# Patient Record
Sex: Male | Born: 1955 | Race: White | Hispanic: No | Marital: Married | Smoking: Current every day smoker
Health system: Southern US, Community
[De-identification: ages and names within clinical notes are randomized; demographics above are authoritative.]

## PROBLEM LIST (undated history)

## (undated) DIAGNOSIS — R0600 Dyspnea, unspecified: Secondary | ICD-10-CM

## (undated) DIAGNOSIS — K219 Gastro-esophageal reflux disease without esophagitis: Secondary | ICD-10-CM

## (undated) DIAGNOSIS — G609 Hereditary and idiopathic neuropathy, unspecified: Secondary | ICD-10-CM

## (undated) DIAGNOSIS — M1990 Unspecified osteoarthritis, unspecified site: Secondary | ICD-10-CM

## (undated) DIAGNOSIS — J449 Chronic obstructive pulmonary disease, unspecified: Secondary | ICD-10-CM

## (undated) DIAGNOSIS — I700 Atherosclerosis of aorta: Secondary | ICD-10-CM

## (undated) DIAGNOSIS — R062 Wheezing: Secondary | ICD-10-CM

## (undated) DIAGNOSIS — D649 Anemia, unspecified: Secondary | ICD-10-CM

## (undated) DIAGNOSIS — R251 Tremor, unspecified: Secondary | ICD-10-CM

## (undated) DIAGNOSIS — H9190 Unspecified hearing loss, unspecified ear: Secondary | ICD-10-CM

## (undated) DIAGNOSIS — F32A Anxiety disorder, unspecified: Secondary | ICD-10-CM

## (undated) DIAGNOSIS — G629 Polyneuropathy, unspecified: Secondary | ICD-10-CM

## (undated) DIAGNOSIS — G8310 Monoplegia of lower limb affecting unspecified side: Secondary | ICD-10-CM

## (undated) DIAGNOSIS — A4902 Methicillin resistant Staphylococcus aureus infection, unspecified site: Secondary | ICD-10-CM

## (undated) DIAGNOSIS — J439 Emphysema, unspecified: Secondary | ICD-10-CM

## (undated) DIAGNOSIS — F419 Anxiety disorder, unspecified: Secondary | ICD-10-CM

## (undated) HISTORY — DX: Hereditary and idiopathic neuropathy, unspecified: G60.9

## (undated) HISTORY — DX: Anxiety disorder, unspecified: F41.9

## (undated) HISTORY — PX: COLONOSCOPY: SHX174

## (undated) HISTORY — DX: Anxiety disorder, unspecified: F32.A

## (undated) HISTORY — DX: Anemia, unspecified: D64.9

## (undated) HISTORY — PX: APPENDECTOMY: SHX54

## (undated) HISTORY — PX: FOOT SURGERY: SHX648

## (undated) HISTORY — PX: OTHER SURGICAL HISTORY: SHX169

## (undated) HISTORY — DX: Atherosclerosis of aorta: I70.0

## (undated) HISTORY — DX: Methicillin resistant Staphylococcus aureus infection, unspecified site: A49.02

## (undated) HISTORY — PX: EYE SURGERY: SHX253

## (undated) HISTORY — DX: Unspecified osteoarthritis, unspecified site: M19.90

## (undated) HISTORY — DX: Emphysema, unspecified: J43.9

## (undated) HISTORY — DX: Monoplegia of lower limb affecting unspecified side: G83.10

---
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
## 1993-09-25 HISTORY — PX: HERNIA REPAIR: SHX51

## 2008-09-25 HISTORY — PX: OTHER SURGICAL HISTORY: SHX169

## 2008-09-25 HISTORY — PX: FOOT SURGERY: SHX648

## 2017-05-07 ENCOUNTER — Encounter: Payer: Self-pay | Admitting: *Deleted

## 2017-05-10 ENCOUNTER — Ambulatory Visit
Admission: RE | Admit: 2017-05-10 | Discharge: 2017-05-10 | Disposition: A | Discharge status: Home / Self Care | Payer: Medicare Other | Source: Ambulatory Visit | Attending: Ophthalmology | Admitting: Ophthalmology

## 2017-05-10 ENCOUNTER — Ambulatory Visit: Payer: Medicare Other | Admitting: Anesthesiology

## 2017-05-10 ENCOUNTER — Encounter
Admission: RE | Disposition: A | Discharge status: Home / Self Care | Payer: Self-pay | Source: Ambulatory Visit | Attending: Ophthalmology

## 2017-05-10 DIAGNOSIS — H2512 Age-related nuclear cataract, left eye: Secondary | ICD-10-CM | POA: Insufficient documentation

## 2017-05-10 DIAGNOSIS — Z7982 Long term (current) use of aspirin: Secondary | ICD-10-CM | POA: Insufficient documentation

## 2017-05-10 DIAGNOSIS — J449 Chronic obstructive pulmonary disease, unspecified: Secondary | ICD-10-CM | POA: Diagnosis not present

## 2017-05-10 DIAGNOSIS — M1990 Unspecified osteoarthritis, unspecified site: Secondary | ICD-10-CM | POA: Diagnosis not present

## 2017-05-10 DIAGNOSIS — R251 Tremor, unspecified: Secondary | ICD-10-CM | POA: Diagnosis not present

## 2017-05-10 DIAGNOSIS — F17200 Nicotine dependence, unspecified, uncomplicated: Secondary | ICD-10-CM | POA: Diagnosis not present

## 2017-05-10 DIAGNOSIS — Z6841 Body Mass Index (BMI) 40.0 and over, adult: Secondary | ICD-10-CM | POA: Insufficient documentation

## 2017-05-10 DIAGNOSIS — Z79899 Other long term (current) drug therapy: Secondary | ICD-10-CM | POA: Insufficient documentation

## 2017-05-10 HISTORY — DX: Chronic obstructive pulmonary disease, unspecified: J44.9

## 2017-05-10 HISTORY — DX: Unspecified osteoarthritis, unspecified site: M19.90

## 2017-05-10 HISTORY — PX: CATARACT EXTRACTION W/PHACO: SHX586

## 2017-05-10 HISTORY — DX: Unspecified hearing loss, unspecified ear: H91.90

## 2017-05-10 HISTORY — DX: Dyspnea, unspecified: R06.00

## 2017-05-10 HISTORY — DX: Wheezing: R06.2

## 2017-05-10 HISTORY — DX: Polyneuropathy, unspecified: G62.9

## 2017-05-10 HISTORY — DX: Tremor, unspecified: R25.1

## 2017-05-10 SURGERY — PHACOEMULSIFICATION, CATARACT, WITH IOL INSERTION
Anesthesia: Monitor Anesthesia Care | Site: Eye | Laterality: Left | Wound class: Clean

## 2017-05-10 MED ORDER — LIDOCAINE HCL (PF) 4 % IJ SOLN
INTRAMUSCULAR | Status: DC | PRN
  Administered 2017-05-10: 4 mL via OPHTHALMIC

## 2017-05-10 MED ORDER — MOXIFLOXACIN HCL 0.5 % OP SOLN: 1.0000 [drp] | OPHTHALMIC | Status: DC | PRN

## 2017-05-10 MED ORDER — FENTANYL CITRATE (PF) 100 MCG/2ML IJ SOLN
INTRAMUSCULAR | Status: AC
  Filled 2017-05-10: qty 2

## 2017-05-10 MED ORDER — CARBACHOL 0.01 % IO SOLN
INTRAOCULAR | Status: DC | PRN
  Administered 2017-05-10: 0.5 mL via INTRAOCULAR

## 2017-05-10 MED ORDER — MIDAZOLAM HCL 2 MG/2ML IJ SOLN
INTRAMUSCULAR | Status: AC
  Filled 2017-05-10: qty 2

## 2017-05-10 MED ORDER — EPINEPHRINE PF 1 MG/ML IJ SOLN
INTRAOCULAR | Status: DC | PRN
  Administered 2017-05-10: 08:00:00 via OPHTHALMIC

## 2017-05-10 MED ORDER — MOXIFLOXACIN HCL 0.5 % OP SOLN
OPHTHALMIC | Status: DC | PRN
  Administered 2017-05-10: 0.2 mL via OPHTHALMIC

## 2017-05-10 MED ORDER — POVIDONE-IODINE 5 % OP SOLN
OPHTHALMIC | Status: DC | PRN
  Administered 2017-05-10: 1 via OPHTHALMIC

## 2017-05-10 MED ORDER — NA CHONDROIT SULF-NA HYALURON 40-30 MG/ML IO SOLN
INTRAOCULAR | Status: DC | PRN
Start: 2017-05-10 — End: 2017-05-10
  Administered 2017-05-10: 0.5 mL via INTRAOCULAR

## 2017-05-10 MED ORDER — MIDAZOLAM HCL 2 MG/2ML IJ SOLN
INTRAMUSCULAR | Status: DC | PRN
  Administered 2017-05-10 (×2): 1 mg via INTRAVENOUS

## 2017-05-10 MED ORDER — SODIUM CHLORIDE 0.9 % IV SOLN
INTRAVENOUS | Status: DC
  Administered 2017-05-10: 07:00:00 via INTRAVENOUS

## 2017-05-10 MED ORDER — ARMC OPHTHALMIC DILATING DROPS
1.0000 "application " | OPHTHALMIC | Status: AC
  Administered 2017-05-10 (×3): 1 via OPHTHALMIC

## 2017-05-10 SURGICAL SUPPLY — 16 items
DISSECTOR HYDRO NUCLEUS 50X22 (MISCELLANEOUS) ×2 IMPLANT
GLOVE BIO SURGEON STRL SZ8 (GLOVE) ×2 IMPLANT
GLOVE BIOGEL M 6.5 STRL (GLOVE) ×2 IMPLANT
GLOVE SURG LX 7.5 STRW (GLOVE) ×1
GLOVE SURG LX STRL 7.5 STRW (GLOVE) ×1 IMPLANT
GOWN STRL REUS W/ TWL LRG LVL3 (GOWN DISPOSABLE) ×2 IMPLANT
GOWN STRL REUS W/TWL LRG LVL3 (GOWN DISPOSABLE) ×2
LABEL CATARACT MEDS ST (LABEL) ×2 IMPLANT
LENS IOL TECNIS ITEC 21.0 (Intraocular Lens) ×2 IMPLANT
PACK CATARACT (MISCELLANEOUS) ×2 IMPLANT
PACK CATARACT KING (MISCELLANEOUS) ×2 IMPLANT
PACK EYE AFTER SURG (MISCELLANEOUS) ×2 IMPLANT
SOL BSS BAG (MISCELLANEOUS) ×2
SOLUTION BSS BAG (MISCELLANEOUS) ×1 IMPLANT
WATER STERILE IRR 250ML POUR (IV SOLUTION) ×2 IMPLANT
WIPE NON LINTING 3.25X3.25 (MISCELLANEOUS) ×2 IMPLANT

## 2017-05-10 NOTE — H&P (Signed)
The History and Physical notes are on paper, have been signed, and are to be scanned.   I have examined the patient and there are no changes to the H&P.   Benay Pillow 05/10/2017 8:01 AM

## 2017-05-10 NOTE — Op Note (Signed)
OPERATIVE NOTE  Jimmy Le [id number] 05/10/2017   PREOPERATIVE DIAGNOSIS:  Nuclear sclerotic cataract left eye.  H25.12   POSTOPERATIVE DIAGNOSIS:    Nuclear sclerotic cataract left eye.     PROCEDURE:  Phacoemusification with posterior chamber intraocular lens placement of the left eye   LENS:   Implant Name Type Inv. Item Serial No. Manufacturer Lot No. LRB No. Used  LENS IOL DIOP 21.0 - [id number] Intraocular Lens LENS IOL DIOP 21.0 [id number] AMO   Left 1       PCB00 +21.0   ULTRASOUND TIME: 0 minutes 24 seconds.  CDE 1.72   SURGEON:  Benay Pillow, MD, MPH   ANESTHESIA:  Topical with tetracaine drops augmented with 1% preservative-free intracameral lidocaine.  ESTIMATED BLOOD LOSS: <1 mL   COMPLICATIONS:  None.   DESCRIPTION OF PROCEDURE:  The patient was identified in the holding room and transported to the operating room and placed in the supine position under the operating microscope.  The left eye was identified as the operative eye and it was prepped and draped in the usual sterile ophthalmic fashion.   A 1.0 millimeter clear-corneal paracentesis was made at the 5:00 position. 0.5 ml of preservative-free 1% lidocaine with epinephrine was injected into the anterior chamber.  The anterior chamber was filled with Discovisc viscoelastic.  A 2.4 millimeter keratome was used to make a near-clear corneal incision at the 2:00 position.  A curvilinear capsulorrhexis was made with a cystotome and capsulorrhexis forceps.  Balanced salt solution was used to hydrodissect and hydrodelineate the nucleus.   Phacoemulsification was then used in stop and chop fashion to remove the lens nucleus and epinucleus.  The remaining cortex was then removed using the irrigation and aspiration handpiece. Discovisc was then placed into the capsular bag to distend it for lens placement.  A lens was then injected into the capsular bag.  The remaining viscoelastic was aspirated.   Wounds were hydrated  with balanced salt solution.  The anterior chamber was inflated to a physiologic pressure with balanced salt solution.  Intracameral vigamox 0.1 mL undiltued was injected into the eye and a drop placed onto the ocular surface.  No wound leaks were noted.  The patient was taken to the recovery room in stable condition without complications of anesthesia or surgery  Benay Pillow 05/10/2017, 8:34 AM

## 2017-05-10 NOTE — Discharge Instructions (Signed)
Eye Surgery Discharge Instructions  Expect mild scratchy sensation or mild soreness. DO NOT RUB YOUR EYE!  The day of surgery:  Minimal physical activity, but bed rest is not required  No reading, computer work, or close hand work  No bending, lifting, or straining.  May watch TV  For 24 hours:  No driving, legal decisions, or alcoholic beverages  Safety precautions  Eat anything you prefer: It is better to start with liquids, then soup then solid foods.  _____ Eye patch should be worn until postoperative exam tomorrow.  ____ Solar shield eyeglasses should be worn for comfort in the sunlight/patch while sleeping  Resume all regular medications including aspirin or Coumadin if these were discontinued prior to surgery. You may shower, bathe, shave, or wash your hair. Tylenol may be taken for mild discomfort.  Call your doctor if you experience significant pain, nausea, or vomiting, fever > 101 or other signs of infection. [phone] or [phone] Specific instructions:  Follow-up Information    Eulogio Bear, MD Follow up on 05/11/2017.   Specialty:  Ophthalmology Why:  10:10 in [facility] [address] Contact information: [place] [address] [phone]

## 2017-05-10 NOTE — Transfer of Care (Signed)
Immediate Anesthesia Transfer of Care Note  Patient: Jimmy Le  Procedure(s) Performed: Procedure(s) with comments: CATARACT EXTRACTION PHACO AND INTRAOCULAR LENS PLACEMENT (IOC) (Left) - [place] 00:24.4 AP% 7.0 CDE 1.72 FLUID PACK LOT # [id number]  Patient Location: PACU and Short Stay  Anesthesia Type:MAC  Level of Consciousness: awake, alert  and oriented  Airway & Oxygen Therapy: Patient Spontanous Breathing  Post-op Assessment: Report given to RN and Post -op Vital signs reviewed and stable  Post vital signs: Reviewed and stable  Last Vitals:  Vitals:   05/10/17 0636 05/10/17 0837  BP: (!) 159/61 (!) 127/58  Pulse:  61  Resp:  20  Temp:  36.5 C  SpO2:  98%    Last Pain:  Vitals:   05/10/17 0633  TempSrc: Tympanic         Complications: No apparent anesthesia complications

## 2017-05-10 NOTE — Anesthesia Post-op Follow-up Note (Signed)
Anesthesia QCDR form completed.        

## 2017-05-10 NOTE — Anesthesia Postprocedure Evaluation (Signed)
Anesthesia Post Note  Patient: Jimmy Le  Procedure(s) Performed: Procedure(s) (LRB): CATARACT EXTRACTION PHACO AND INTRAOCULAR LENS PLACEMENT (IOC) (Left)  Patient location during evaluation: Short Stay Anesthesia Type: MAC Level of consciousness: awake and alert and oriented Pain management: pain level controlled Vital Signs Assessment: post-procedure vital signs reviewed and stable Respiratory status: spontaneous breathing and nonlabored ventilation Cardiovascular status: stable Postop Assessment: no headache and adequate PO intake Anesthetic complications: no     Last Vitals:  Vitals:   05/10/17 0836 05/10/17 0837  BP: (!) 127/58 (!) 127/58  Pulse: (!) 55 61  Resp: 16 20  Temp: 36.9 C 36.5 C  SpO2: 98% 98%    Last Pain:  Vitals:   05/10/17 0836  TempSrc: Burnett Corrente

## 2017-05-10 NOTE — Anesthesia Preprocedure Evaluation (Signed)
Anesthesia Evaluation  Patient identified by MRN, date of birth, ID band Patient awake    Reviewed: Allergy & Precautions, NPO status , Patient's Chart, lab work & pertinent test results  History of Anesthesia Complications Negative for: history of anesthetic complications  Airway Mallampati: II       Dental   Pulmonary COPD, Current Smoker,           Cardiovascular (-) hypertension(-) Past MI and (-) CHF (-) dysrhythmias (-) Valvular Problems/Murmurs     Neuro/Psych neg Seizures    GI/Hepatic Neg liver ROS, neg GERD  ,  Endo/Other  neg diabetesMorbid obesity  Renal/GU negative Renal ROS     Musculoskeletal   Abdominal   Peds  Hematology   Anesthesia Other Findings   Reproductive/Obstetrics                            Anesthesia Physical Anesthesia Plan  ASA: III  Anesthesia Plan: MAC   Post-op Pain Management:    Induction:   PONV Risk Score and Plan:   Airway Management Planned: Nasal Cannula  Additional Equipment:   Intra-op Plan:   Post-operative Plan:   Informed Consent: I have reviewed the patients History and Physical, chart, labs and discussed the procedure including the risks, benefits and alternatives for the proposed anesthesia with the patient or authorized representative who has indicated his/her understanding and acceptance.     Plan Discussed with:   Anesthesia Plan Comments:         Anesthesia Quick Evaluation

## 2017-05-11 ENCOUNTER — Encounter: Payer: Self-pay | Admitting: Ophthalmology

## 2017-06-05 ENCOUNTER — Encounter: Payer: Self-pay | Admitting: *Deleted

## 2017-06-07 ENCOUNTER — Ambulatory Visit
Admission: RE | Admit: 2017-06-07 | Discharge: 2017-06-07 | Disposition: A | Discharge status: Home / Self Care | Payer: Medicare Other | Source: Ambulatory Visit | Attending: Ophthalmology | Admitting: Ophthalmology

## 2017-06-07 ENCOUNTER — Ambulatory Visit: Payer: Medicare Other | Admitting: Anesthesiology

## 2017-06-07 ENCOUNTER — Encounter
Admission: RE | Disposition: A | Discharge status: Home / Self Care | Payer: Self-pay | Source: Ambulatory Visit | Attending: Ophthalmology

## 2017-06-07 DIAGNOSIS — R251 Tremor, unspecified: Secondary | ICD-10-CM | POA: Insufficient documentation

## 2017-06-07 DIAGNOSIS — M1990 Unspecified osteoarthritis, unspecified site: Secondary | ICD-10-CM | POA: Diagnosis not present

## 2017-06-07 DIAGNOSIS — J439 Emphysema, unspecified: Secondary | ICD-10-CM | POA: Diagnosis not present

## 2017-06-07 DIAGNOSIS — I10 Essential (primary) hypertension: Secondary | ICD-10-CM | POA: Insufficient documentation

## 2017-06-07 DIAGNOSIS — Z6841 Body Mass Index (BMI) 40.0 and over, adult: Secondary | ICD-10-CM | POA: Diagnosis not present

## 2017-06-07 DIAGNOSIS — G629 Polyneuropathy, unspecified: Secondary | ICD-10-CM | POA: Diagnosis not present

## 2017-06-07 DIAGNOSIS — Z7982 Long term (current) use of aspirin: Secondary | ICD-10-CM | POA: Insufficient documentation

## 2017-06-07 DIAGNOSIS — Z79899 Other long term (current) drug therapy: Secondary | ICD-10-CM | POA: Insufficient documentation

## 2017-06-07 DIAGNOSIS — H2511 Age-related nuclear cataract, right eye: Secondary | ICD-10-CM | POA: Diagnosis present

## 2017-06-07 HISTORY — PX: CATARACT EXTRACTION W/PHACO: SHX586

## 2017-06-07 SURGERY — PHACOEMULSIFICATION, CATARACT, WITH IOL INSERTION
Anesthesia: Monitor Anesthesia Care | Site: Eye | Laterality: Right | Wound class: Clean

## 2017-06-07 MED ORDER — CARBACHOL 0.01 % IO SOLN
INTRAOCULAR | Status: DC | PRN
  Administered 2017-06-07: 0.5 mL via INTRAOCULAR

## 2017-06-07 MED ORDER — NA CHONDROIT SULF-NA HYALURON 40-30 MG/ML IO SOLN
INTRAOCULAR | Status: DC | PRN
  Administered 2017-06-07: 0.5 mL via INTRAOCULAR

## 2017-06-07 MED ORDER — LIDOCAINE HCL (PF) 4 % IJ SOLN
INTRAOCULAR | Status: DC | PRN
  Administered 2017-06-07: 4 mL via OPHTHALMIC

## 2017-06-07 MED ORDER — EPINEPHRINE PF 1 MG/ML IJ SOLN
INTRAMUSCULAR | Status: AC
  Filled 2017-06-07: qty 1

## 2017-06-07 MED ORDER — POVIDONE-IODINE 5 % OP SOLN
OPHTHALMIC | Status: DC | PRN
  Administered 2017-06-07: 1 via OPHTHALMIC

## 2017-06-07 MED ORDER — FENTANYL CITRATE (PF) 100 MCG/2ML IJ SOLN
INTRAMUSCULAR | Status: DC | PRN
  Administered 2017-06-07 (×4): 25 ug via INTRAVENOUS

## 2017-06-07 MED ORDER — POVIDONE-IODINE 5 % OP SOLN
OPHTHALMIC | Status: AC
  Filled 2017-06-07: qty 30

## 2017-06-07 MED ORDER — LIDOCAINE HCL (PF) 4 % IJ SOLN
INTRAMUSCULAR | Status: AC
  Filled 2017-06-07: qty 5

## 2017-06-07 MED ORDER — ARMC OPHTHALMIC DILATING DROPS
OPHTHALMIC | Status: AC
  Administered 2017-06-07: 1 via OPHTHALMIC
  Filled 2017-06-07: qty 0.4

## 2017-06-07 MED ORDER — ARMC OPHTHALMIC DILATING DROPS
1.0000 "application " | OPHTHALMIC | Status: AC
  Administered 2017-06-07 (×3): 1 via OPHTHALMIC

## 2017-06-07 MED ORDER — MIDAZOLAM HCL 2 MG/2ML IJ SOLN
INTRAMUSCULAR | Status: DC | PRN
  Administered 2017-06-07 (×2): 1 mg via INTRAVENOUS

## 2017-06-07 MED ORDER — MOXIFLOXACIN HCL 0.5 % OP SOLN
OPHTHALMIC | Status: AC
  Filled 2017-06-07: qty 3

## 2017-06-07 MED ORDER — MOXIFLOXACIN HCL 0.5 % OP SOLN
OPHTHALMIC | Status: DC | PRN
  Administered 2017-06-07: 0.2 mL via OPHTHALMIC

## 2017-06-07 MED ORDER — FENTANYL CITRATE (PF) 100 MCG/2ML IJ SOLN
INTRAMUSCULAR | Status: AC
  Filled 2017-06-07: qty 2

## 2017-06-07 MED ORDER — NA CHONDROIT SULF-NA HYALURON 40-17 MG/ML IO SOLN
INTRAOCULAR | Status: AC
  Filled 2017-06-07: qty 1

## 2017-06-07 MED ORDER — EPINEPHRINE PF 1 MG/ML IJ SOLN
INTRAOCULAR | Status: DC | PRN
  Administered 2017-06-07: 09:00:00 via OPHTHALMIC

## 2017-06-07 MED ORDER — MIDAZOLAM HCL 2 MG/2ML IJ SOLN
INTRAMUSCULAR | Status: AC
  Filled 2017-06-07: qty 2

## 2017-06-07 MED ORDER — SODIUM CHLORIDE 0.9 % IV SOLN
INTRAVENOUS | Status: DC
  Administered 2017-06-07: 09:00:00 via INTRAVENOUS

## 2017-06-07 MED ORDER — MOXIFLOXACIN HCL 0.5 % OP SOLN
1.0000 [drp] | OPHTHALMIC | Status: DC | PRN
Start: 2017-06-07 — End: 2017-06-09

## 2017-06-07 SURGICAL SUPPLY — 16 items
DISSECTOR HYDRO NUCLEUS 50X22 (MISCELLANEOUS) ×2 IMPLANT
GLOVE BIO SURGEON STRL SZ8 (GLOVE) ×2 IMPLANT
GLOVE BIOGEL M 6.5 STRL (GLOVE) ×2 IMPLANT
GLOVE SURG LX 7.5 STRW (GLOVE) ×1
GLOVE SURG LX STRL 7.5 STRW (GLOVE) ×1 IMPLANT
GOWN STRL REUS W/ TWL LRG LVL3 (GOWN DISPOSABLE) ×2 IMPLANT
GOWN STRL REUS W/TWL LRG LVL3 (GOWN DISPOSABLE) ×2
LABEL CATARACT MEDS ST (LABEL) ×2 IMPLANT
LENS IOL TECNIS ITEC 20.5 (Intraocular Lens) ×2 IMPLANT
PACK CATARACT (MISCELLANEOUS) ×2 IMPLANT
PACK CATARACT KING (MISCELLANEOUS) ×2 IMPLANT
PACK EYE AFTER SURG (MISCELLANEOUS) ×2 IMPLANT
SOL BSS BAG (MISCELLANEOUS) ×2
SOLUTION BSS BAG (MISCELLANEOUS) ×1 IMPLANT
WATER STERILE IRR 250ML POUR (IV SOLUTION) ×2 IMPLANT
WIPE NON LINTING 3.25X3.25 (MISCELLANEOUS) ×2 IMPLANT

## 2017-06-07 NOTE — H&P (Signed)
The History and Physical notes are on paper, have been signed, and are to be scanned.   I have examined the patient and there are no changes to the H&P.   Benay Pillow 06/07/2017 8:17 AM

## 2017-06-07 NOTE — Anesthesia Preprocedure Evaluation (Signed)
Anesthesia Evaluation  Patient identified by MRN, date of birth, ID band Patient awake    Reviewed: Allergy & Precautions, H&P , NPO status , Patient's Chart, lab work & pertinent test results, reviewed documented beta blocker date and time   Airway Mallampati: II  TM Distance: >3 FB Neck ROM: full    Dental no notable dental hx. (+) Teeth Intact, Poor Dentition   Pulmonary neg pulmonary ROS, shortness of breath and with exertion, COPD, Current Smoker,    Pulmonary exam normal breath sounds clear to auscultation       Cardiovascular Exercise Tolerance: Good hypertension, negative cardio ROS   Rhythm:regular Rate:Normal     Neuro/Psych negative neurological ROS  negative psych ROS   GI/Hepatic negative GI ROS, Neg liver ROS,   Endo/Other  negative endocrine ROSdiabetesMorbid obesity  Renal/GU      Musculoskeletal   Abdominal   Peds  Hematology negative hematology ROS (+)   Anesthesia Other Findings   Reproductive/Obstetrics negative OB ROS                             Anesthesia Physical Anesthesia Plan  ASA: III  Anesthesia Plan: MAC   Post-op Pain Management:    Induction:   PONV Risk Score and Plan:   Airway Management Planned:   Additional Equipment:   Intra-op Plan:   Post-operative Plan:   Informed Consent: I have reviewed the patients History and Physical, chart, labs and discussed the procedure including the risks, benefits and alternatives for the proposed anesthesia with the patient or authorized representative who has indicated his/her understanding and acceptance.     Plan Discussed with: CRNA  Anesthesia Plan Comments:         Anesthesia Quick Evaluation

## 2017-06-07 NOTE — Op Note (Signed)
OPERATIVE NOTE  Jimmy Le [id number] 06/07/2017   PREOPERATIVE DIAGNOSIS:  Nuclear sclerotic cataract right eye.  H25.11   POSTOPERATIVE DIAGNOSIS:    Nuclear sclerotic cataract right eye.     PROCEDURE:  Phacoemusification with posterior chamber intraocular lens placement of the right eye   LENS:   Implant Name Type Inv. Item Serial No. Manufacturer Lot No. LRB No. Used  LENS IOL DIOP 20.5 - [id number] Intraocular Lens LENS IOL DIOP 20.5 [id number] AMO   Right 1       PCB00 +20.5   ULTRASOUND TIME: 0 minutes 22.6 seconds.  CDE 2.07   SURGEON:  Benay Pillow, MD, MPH  ANESTHESIOLOGIST: Anesthesiologist: Molli Barrows, MD CRNA: Jonna Clark, CRNA   ANESTHESIA:  Topical with tetracaine drops augmented with 1% preservative-free intracameral lidocaine.  ESTIMATED BLOOD LOSS: less than 1 mL.   COMPLICATIONS:  None.   DESCRIPTION OF PROCEDURE:  The patient was identified in the holding room and transported to the operating room and placed in the supine position under the operating microscope.  The right eye was identified as the operative eye and it was prepped and draped in the usual sterile ophthalmic fashion.   A 1.0 millimeter clear-corneal paracentesis was made at the 10:30 position. 0.5 ml of preservative-free 1% lidocaine with epinephrine was injected into the anterior chamber.  The anterior chamber was filled with Discovisc viscoelastic.  A 2.4 millimeter keratome was used to make a near-clear corneal incision at the 8:00 position.  A curvilinear capsulorrhexis was made with a cystotome and capsulorrhexis forceps.  Balanced salt solution was used to hydrodissect and hydrodelineate the nucleus.   Phacoemulsification was then used in stop and chop fashion to remove the lens nucleus and epinucleus.  The remaining cortex was then removed using the irrigation and aspiration handpiece. Discovisc was then placed into the capsular bag to distend it for lens placement.  A lens  was then injected into the capsular bag.  The remaining viscoelastic was aspirated.   Wounds were hydrated with balanced salt solution.  The anterior chamber was inflated to a physiologic pressure with balanced salt solution.   Intracameral vigamox 0.1 mL undiluted was injected into the eye and a drop placed onto the ocular surface.  No wound leaks were noted.  The patient was taken to the recovery room in stable condition without complications of anesthesia or surgery  Benay Pillow 06/07/2017, 9:11 AM

## 2017-06-07 NOTE — Transfer of Care (Signed)
Immediate Anesthesia Transfer of Care Note  Patient: Jimmy Le  Procedure(s) Performed: Procedure(s) with comments: CATARACT EXTRACTION PHACO AND INTRAOCULAR LENS PLACEMENT (IOC) (Right) - [place] 00:22.6 AP% 9.2 CDE 2.07 FLUID PACK LOT # [id number] H  Patient Location: PACU and Short Stay  Anesthesia Type:MAC  Level of Consciousness: awake, alert  and oriented  Airway & Oxygen Therapy: Patient Spontanous Breathing  Post-op Assessment: Report given to RN and Post -op Vital signs reviewed and stable  Post vital signs: Reviewed and stable  Last Vitals:  Vitals:   06/07/17 0711 06/07/17 0915  BP: (!) 151/59 (!) 122/59  Pulse: 75 71  Resp: (!) 22 18  Temp: 36.8 C   SpO2: 98% 97%    Last Pain:  Vitals:   06/07/17 0711  TempSrc: Tympanic         Complications: No apparent anesthesia complications

## 2017-06-07 NOTE — Discharge Instructions (Signed)
Eye Surgery Discharge Instructions  Expect mild scratchy sensation or mild soreness. DO NOT RUB YOUR EYE!  The day of surgery:  Minimal physical activity, but bed rest is not required  No reading, computer work, or close hand work  No bending, lifting, or straining.  May watch TV  For 24 hours:  No driving, legal decisions, or alcoholic beverages  Safety precautions  Eat anything you prefer: It is better to start with liquids, then soup then solid foods.  _____ Eye patch should be worn until postoperative exam tomorrow.  ____ Solar shield eyeglasses should be worn for comfort in the sunlight/patch while sleeping  Resume all regular medications including aspirin or Coumadin if these were discontinued prior to surgery. You may shower, bathe, shave, or wash your hair. Tylenol may be taken for mild discomfort.  Call your doctor if you experience significant pain, nausea, or vomiting, fever > 101 or other signs of infection. [phone] or [phone] Specific instructions:  Follow-up Information    Eulogio Bear, MD Follow up.   Specialty:  Ophthalmology Why:  September 14 at 10:05am Contact information: [address] [place] [address] [phone]

## 2017-06-07 NOTE — Anesthesia Postprocedure Evaluation (Signed)
Anesthesia Post Note  Patient: Jimmy Le  Procedure(s) Performed: Procedure(s) (LRB): CATARACT EXTRACTION PHACO AND INTRAOCULAR LENS PLACEMENT (IOC) (Right)  Patient location during evaluation: Short Stay Anesthesia Type: MAC Level of consciousness: awake and alert and oriented Pain management: pain level controlled Vital Signs Assessment: post-procedure vital signs reviewed and stable Respiratory status: spontaneous breathing and nonlabored ventilation Cardiovascular status: stable Postop Assessment: no headache, adequate PO intake and no signs of nausea or vomiting Anesthetic complications: no     Last Vitals:  Vitals:   06/07/17 0912 06/07/17 0915  BP: (!) 122/59 (!) 122/59  Pulse: 66 71  Resp: 18 18  Temp: 36.5 C   SpO2:  97%    Last Pain:  Vitals:   06/07/17 0711  TempSrc: Tympanic                 Lanora Manis

## 2017-06-07 NOTE — Anesthesia Post-op Follow-up Note (Signed)
Anesthesia QCDR form completed.        

## 2018-09-25 DIAGNOSIS — D649 Anemia, unspecified: Secondary | ICD-10-CM | POA: Diagnosis not present

## 2018-09-25 DIAGNOSIS — J449 Chronic obstructive pulmonary disease, unspecified: Secondary | ICD-10-CM | POA: Diagnosis not present

## 2018-09-25 DIAGNOSIS — I10 Essential (primary) hypertension: Secondary | ICD-10-CM | POA: Diagnosis not present

## 2018-09-25 DIAGNOSIS — L089 Local infection of the skin and subcutaneous tissue, unspecified: Secondary | ICD-10-CM | POA: Diagnosis not present

## 2018-09-26 DIAGNOSIS — L089 Local infection of the skin and subcutaneous tissue, unspecified: Secondary | ICD-10-CM | POA: Diagnosis not present

## 2018-09-26 DIAGNOSIS — D649 Anemia, unspecified: Secondary | ICD-10-CM | POA: Diagnosis not present

## 2018-09-27 DIAGNOSIS — G629 Polyneuropathy, unspecified: Secondary | ICD-10-CM | POA: Diagnosis not present

## 2018-09-27 DIAGNOSIS — D649 Anemia, unspecified: Secondary | ICD-10-CM | POA: Diagnosis not present

## 2018-09-29 DIAGNOSIS — F419 Anxiety disorder, unspecified: Secondary | ICD-10-CM | POA: Diagnosis not present

## 2018-09-29 DIAGNOSIS — F17210 Nicotine dependence, cigarettes, uncomplicated: Secondary | ICD-10-CM | POA: Diagnosis not present

## 2018-09-29 DIAGNOSIS — G8929 Other chronic pain: Secondary | ICD-10-CM | POA: Diagnosis not present

## 2018-09-29 DIAGNOSIS — M25511 Pain in right shoulder: Secondary | ICD-10-CM | POA: Diagnosis not present

## 2018-09-29 DIAGNOSIS — L03116 Cellulitis of left lower limb: Secondary | ICD-10-CM | POA: Diagnosis not present

## 2018-09-29 DIAGNOSIS — G629 Polyneuropathy, unspecified: Secondary | ICD-10-CM | POA: Diagnosis not present

## 2018-09-29 DIAGNOSIS — J439 Emphysema, unspecified: Secondary | ICD-10-CM | POA: Diagnosis not present

## 2018-09-29 DIAGNOSIS — D509 Iron deficiency anemia, unspecified: Secondary | ICD-10-CM | POA: Diagnosis not present

## 2018-09-29 DIAGNOSIS — F329 Major depressive disorder, single episode, unspecified: Secondary | ICD-10-CM | POA: Diagnosis not present

## 2018-09-29 DIAGNOSIS — G2581 Restless legs syndrome: Secondary | ICD-10-CM | POA: Diagnosis not present

## 2018-09-29 DIAGNOSIS — I10 Essential (primary) hypertension: Secondary | ICD-10-CM | POA: Diagnosis not present

## 2018-09-29 DIAGNOSIS — G4733 Obstructive sleep apnea (adult) (pediatric): Secondary | ICD-10-CM | POA: Diagnosis not present

## 2018-09-29 DIAGNOSIS — E785 Hyperlipidemia, unspecified: Secondary | ICD-10-CM | POA: Diagnosis not present

## 2018-10-01 DIAGNOSIS — D649 Anemia, unspecified: Secondary | ICD-10-CM | POA: Diagnosis not present

## 2018-10-01 DIAGNOSIS — L84 Corns and callosities: Secondary | ICD-10-CM | POA: Diagnosis not present

## 2018-10-01 DIAGNOSIS — Z09 Encounter for follow-up examination after completed treatment for conditions other than malignant neoplasm: Secondary | ICD-10-CM | POA: Diagnosis not present

## 2018-10-01 DIAGNOSIS — Z6841 Body Mass Index (BMI) 40.0 and over, adult: Secondary | ICD-10-CM | POA: Diagnosis not present

## 2018-10-01 DIAGNOSIS — M25475 Effusion, left foot: Secondary | ICD-10-CM | POA: Diagnosis not present

## 2018-10-01 DIAGNOSIS — Z79899 Other long term (current) drug therapy: Secondary | ICD-10-CM | POA: Diagnosis not present

## 2018-10-09 DIAGNOSIS — L84 Corns and callosities: Secondary | ICD-10-CM | POA: Diagnosis not present

## 2018-10-09 DIAGNOSIS — L97521 Non-pressure chronic ulcer of other part of left foot limited to breakdown of skin: Secondary | ICD-10-CM | POA: Diagnosis not present

## 2018-10-09 DIAGNOSIS — Z6841 Body Mass Index (BMI) 40.0 and over, adult: Secondary | ICD-10-CM | POA: Diagnosis not present

## 2018-10-09 DIAGNOSIS — M25475 Effusion, left foot: Secondary | ICD-10-CM | POA: Diagnosis not present

## 2018-10-31 DIAGNOSIS — R195 Other fecal abnormalities: Secondary | ICD-10-CM | POA: Diagnosis not present

## 2018-10-31 DIAGNOSIS — K635 Polyp of colon: Secondary | ICD-10-CM | POA: Diagnosis not present

## 2018-10-31 DIAGNOSIS — D649 Anemia, unspecified: Secondary | ICD-10-CM | POA: Diagnosis not present

## 2018-10-31 DIAGNOSIS — K5790 Diverticulosis of intestine, part unspecified, without perforation or abscess without bleeding: Secondary | ICD-10-CM | POA: Diagnosis not present

## 2018-11-01 DIAGNOSIS — L97529 Non-pressure chronic ulcer of other part of left foot with unspecified severity: Secondary | ICD-10-CM | POA: Diagnosis not present

## 2018-11-01 DIAGNOSIS — M21962 Unspecified acquired deformity of left lower leg: Secondary | ICD-10-CM | POA: Diagnosis not present

## 2018-11-01 DIAGNOSIS — B351 Tinea unguium: Secondary | ICD-10-CM | POA: Diagnosis not present

## 2018-11-01 DIAGNOSIS — I739 Peripheral vascular disease, unspecified: Secondary | ICD-10-CM | POA: Diagnosis not present

## 2018-11-01 DIAGNOSIS — M21961 Unspecified acquired deformity of right lower leg: Secondary | ICD-10-CM | POA: Diagnosis not present

## 2018-11-06 DIAGNOSIS — K2970 Gastritis, unspecified, without bleeding: Secondary | ICD-10-CM | POA: Diagnosis not present

## 2018-11-06 DIAGNOSIS — K317 Polyp of stomach and duodenum: Secondary | ICD-10-CM | POA: Diagnosis not present

## 2018-11-06 DIAGNOSIS — G4700 Insomnia, unspecified: Secondary | ICD-10-CM | POA: Diagnosis not present

## 2018-11-06 DIAGNOSIS — F419 Anxiety disorder, unspecified: Secondary | ICD-10-CM | POA: Diagnosis not present

## 2018-11-06 DIAGNOSIS — K5730 Diverticulosis of large intestine without perforation or abscess without bleeding: Secondary | ICD-10-CM | POA: Diagnosis not present

## 2018-11-06 DIAGNOSIS — J449 Chronic obstructive pulmonary disease, unspecified: Secondary | ICD-10-CM | POA: Diagnosis not present

## 2018-11-06 DIAGNOSIS — E785 Hyperlipidemia, unspecified: Secondary | ICD-10-CM | POA: Diagnosis not present

## 2018-11-06 DIAGNOSIS — Z86010 Personal history of colonic polyps: Secondary | ICD-10-CM | POA: Diagnosis not present

## 2018-11-06 DIAGNOSIS — K648 Other hemorrhoids: Secondary | ICD-10-CM | POA: Diagnosis not present

## 2018-11-06 DIAGNOSIS — K621 Rectal polyp: Secondary | ICD-10-CM | POA: Diagnosis not present

## 2018-11-06 DIAGNOSIS — F17200 Nicotine dependence, unspecified, uncomplicated: Secondary | ICD-10-CM | POA: Diagnosis not present

## 2018-11-06 DIAGNOSIS — D649 Anemia, unspecified: Secondary | ICD-10-CM | POA: Diagnosis not present

## 2018-11-06 DIAGNOSIS — D123 Benign neoplasm of transverse colon: Secondary | ICD-10-CM | POA: Diagnosis not present

## 2018-11-06 DIAGNOSIS — K2960 Other gastritis without bleeding: Secondary | ICD-10-CM | POA: Diagnosis not present

## 2018-11-06 DIAGNOSIS — Z7982 Long term (current) use of aspirin: Secondary | ICD-10-CM | POA: Diagnosis not present

## 2018-11-06 DIAGNOSIS — G4733 Obstructive sleep apnea (adult) (pediatric): Secondary | ICD-10-CM | POA: Diagnosis not present

## 2018-11-06 DIAGNOSIS — E669 Obesity, unspecified: Secondary | ICD-10-CM | POA: Diagnosis not present

## 2018-11-06 DIAGNOSIS — K635 Polyp of colon: Secondary | ICD-10-CM | POA: Diagnosis not present

## 2018-11-06 DIAGNOSIS — K319 Disease of stomach and duodenum, unspecified: Secondary | ICD-10-CM | POA: Diagnosis not present

## 2018-11-06 DIAGNOSIS — F329 Major depressive disorder, single episode, unspecified: Secondary | ICD-10-CM | POA: Diagnosis not present

## 2018-11-06 DIAGNOSIS — I10 Essential (primary) hypertension: Secondary | ICD-10-CM | POA: Diagnosis not present

## 2018-11-11 ENCOUNTER — Other Ambulatory Visit (HOSPITAL_COMMUNITY): Payer: Self-pay | Admitting: Podiatry

## 2018-11-11 DIAGNOSIS — I739 Peripheral vascular disease, unspecified: Secondary | ICD-10-CM

## 2018-11-11 DIAGNOSIS — L97529 Non-pressure chronic ulcer of other part of left foot with unspecified severity: Secondary | ICD-10-CM

## 2018-11-13 ENCOUNTER — Ambulatory Visit (HOSPITAL_COMMUNITY)
Admission: RE | Admit: 2018-11-13 | Discharge: 2018-11-13 | Disposition: A | Discharge status: Home / Self Care | Payer: PPO | Source: Ambulatory Visit | Attending: Internal Medicine | Admitting: Internal Medicine

## 2018-11-13 DIAGNOSIS — G609 Hereditary and idiopathic neuropathy, unspecified: Secondary | ICD-10-CM | POA: Diagnosis not present

## 2018-11-13 DIAGNOSIS — M79672 Pain in left foot: Secondary | ICD-10-CM | POA: Diagnosis not present

## 2018-11-13 DIAGNOSIS — I739 Peripheral vascular disease, unspecified: Secondary | ICD-10-CM

## 2018-11-13 DIAGNOSIS — M79671 Pain in right foot: Secondary | ICD-10-CM | POA: Diagnosis not present

## 2018-11-13 DIAGNOSIS — L97529 Non-pressure chronic ulcer of other part of left foot with unspecified severity: Secondary | ICD-10-CM

## 2018-12-03 DIAGNOSIS — Z6841 Body Mass Index (BMI) 40.0 and over, adult: Secondary | ICD-10-CM | POA: Diagnosis not present

## 2018-12-03 DIAGNOSIS — J982 Interstitial emphysema: Secondary | ICD-10-CM | POA: Diagnosis not present

## 2018-12-03 DIAGNOSIS — D649 Anemia, unspecified: Secondary | ICD-10-CM | POA: Diagnosis not present

## 2018-12-03 DIAGNOSIS — G609 Hereditary and idiopathic neuropathy, unspecified: Secondary | ICD-10-CM | POA: Diagnosis not present

## 2018-12-03 DIAGNOSIS — R7303 Prediabetes: Secondary | ICD-10-CM | POA: Diagnosis not present

## 2018-12-03 DIAGNOSIS — Z720 Tobacco use: Secondary | ICD-10-CM | POA: Diagnosis not present

## 2018-12-03 DIAGNOSIS — M1990 Unspecified osteoarthritis, unspecified site: Secondary | ICD-10-CM | POA: Diagnosis not present

## 2018-12-03 DIAGNOSIS — E782 Mixed hyperlipidemia: Secondary | ICD-10-CM | POA: Diagnosis not present

## 2018-12-03 DIAGNOSIS — Z1331 Encounter for screening for depression: Secondary | ICD-10-CM | POA: Diagnosis not present

## 2018-12-03 DIAGNOSIS — F418 Other specified anxiety disorders: Secondary | ICD-10-CM | POA: Diagnosis not present

## 2018-12-03 DIAGNOSIS — R350 Frequency of micturition: Secondary | ICD-10-CM | POA: Diagnosis not present

## 2018-12-05 DIAGNOSIS — D126 Benign neoplasm of colon, unspecified: Secondary | ICD-10-CM | POA: Diagnosis not present

## 2018-12-05 DIAGNOSIS — D131 Benign neoplasm of stomach: Secondary | ICD-10-CM | POA: Diagnosis not present

## 2018-12-05 DIAGNOSIS — K649 Unspecified hemorrhoids: Secondary | ICD-10-CM | POA: Diagnosis not present

## 2018-12-05 DIAGNOSIS — K2960 Other gastritis without bleeding: Secondary | ICD-10-CM | POA: Diagnosis not present

## 2018-12-05 DIAGNOSIS — D649 Anemia, unspecified: Secondary | ICD-10-CM | POA: Diagnosis not present

## 2018-12-11 ENCOUNTER — Ambulatory Visit (HOSPITAL_COMMUNITY)
Admission: RE | Admit: 2018-12-11 | Discharge: 2018-12-11 | Disposition: A | Discharge status: Home / Self Care | Payer: PPO | Source: Ambulatory Visit | Attending: Internal Medicine | Admitting: Internal Medicine

## 2018-12-11 ENCOUNTER — Other Ambulatory Visit: Payer: Self-pay

## 2018-12-11 DIAGNOSIS — L97529 Non-pressure chronic ulcer of other part of left foot with unspecified severity: Secondary | ICD-10-CM

## 2018-12-11 DIAGNOSIS — I739 Peripheral vascular disease, unspecified: Secondary | ICD-10-CM

## 2018-12-13 DIAGNOSIS — I739 Peripheral vascular disease, unspecified: Secondary | ICD-10-CM | POA: Diagnosis not present

## 2018-12-13 DIAGNOSIS — L97529 Non-pressure chronic ulcer of other part of left foot with unspecified severity: Secondary | ICD-10-CM | POA: Diagnosis not present

## 2018-12-13 DIAGNOSIS — M21961 Unspecified acquired deformity of right lower leg: Secondary | ICD-10-CM | POA: Diagnosis not present

## 2018-12-13 DIAGNOSIS — M21962 Unspecified acquired deformity of left lower leg: Secondary | ICD-10-CM | POA: Diagnosis not present

## 2019-01-20 ENCOUNTER — Other Ambulatory Visit: Payer: Self-pay | Admitting: Pharmacist

## 2019-01-20 NOTE — Patient Outreach (Signed)
[facility]) Care Management  [place]   01/20/2019  Jimmy Le Nov 24, 1955 [id number]  Reason for referral: Medication Review, Medication Assistance  Referral source: Health Risk Assessmente Current insurance: [employer]  PMHx includes but not limited to:  Anemia, neuropathy, anxiety / depression, HTN, COPD, HLD, psoriatic arthropathy, RLS, OSA  Outreach:  Successful telephone call with Jimmy Le.  HIPAA identifiers verified.   Subjective:  Patient reports that he is currently in the coverage gap and co-pays have increased.  He is still able to afford medications but states it is very expensive.  He has not heard of Extra Help LIS and has not applied for it in the past.     Objective: No results found for: CREATININE  No results found for: HGBA1C  Lipid Panel  No results found for: CHOL, TRIG, HDL, CHOLHDL, VLDL, LDLCALC, LDLDIRECT  BP Readings from Last 3 Encounters:  06/07/17 132/64  05/10/17 118/65    No Known Allergies  Medications Reviewed Today    Reviewed by Rudean Haskell, RPH (Pharmacist) on 01/20/19 at [place] List Status: <None>  Medication Order Taking? Sig Documenting Provider Last Dose Status Informant  ARIPiprazole (ABILIFY) 5 MG tablet [id number] Yes Take 5 mg by mouth daily.  [provider] Taking Active Self  aspirin 81 MG tablet [id number] Yes Take 81 mg by mouth daily. [provider] Taking Active   aspirin EC 325 MG tablet [id number] Yes Take 650 mg by mouth daily as needed for mild pain or moderate pain. [provider] Taking Active Self  calcium carbonate (TUMS - DOSED IN MG ELEMENTAL CALCIUM) 500 MG chewable tablet [id number] Yes Chew 1 tablet by mouth daily as needed for indigestion or heartburn. [provider] Taking Active Self  clonazePAM (KLONOPIN) 0.5 MG tablet [id number] Yes Take 0.5 mg by mouth 2 (two) times daily. [provider] Taking Active Self   escitalopram (LEXAPRO) 20 MG tablet [id number] Yes Take 20 mg by mouth daily. [provider] Taking Active Self  omeprazole (PRILOSEC) 20 MG capsule [id number] Yes Take 20 mg by mouth daily. [provider] Taking Active   oxybutynin (DITROPAN) 5 MG tablet [id number] Yes Take 5 mg by mouth as directed. EVERY 5 TO 6 HOURS [provider] Taking Active Self          Assessment:  Drugs sorted by system:  Neurologic/Psychologic: aripiprazole, clonazepam, escitalopram  Cardiovascular: aspirin 81mg   Gastrointestinal: omeprazole, calcium carbonate  Genitourinary: oxybutynin  Medication Assistance Findings:  Medication assistance needs identified.   Extra Help:  May be eligible for Full or Partial Extra Help Low Income Subsidy eligible based on reported income and assets  Plan: . Will f/u with patient on Friday per his request to continue applying for Extra Help  Ralene Bathe, PharmD, [place] [phone]

## 2019-01-24 ENCOUNTER — Other Ambulatory Visit: Payer: Self-pay | Admitting: Pharmacist

## 2019-01-24 ENCOUNTER — Other Ambulatory Visit: Payer: Self-pay | Admitting: *Deleted

## 2019-01-24 ENCOUNTER — Ambulatory Visit: Payer: Self-pay | Admitting: Pharmacist

## 2019-01-24 NOTE — Patient Outreach (Signed)
[place] [facility]) Care Management  [place] 01/24/2019  Jimmy Le 1955-10-15 [id number]  Reason for call: f/u on assisting patient with Extra Help LIS  Successful call with Jimmy Le today.  Patient reports he is busy right now and does not wish to move forward with Extra Help LIS application online.  I offered to call him another day but patient declined.  He states he would like to see how his new insurance handles his medications and will consider applying later in the year if needed.  I provided him with my contact information should he wish to contact me in the future.   Plan: Will close [facility] pharmacy case at this time.  Thank you for allowing [facility] pharmacy to be involved in this patient's care.     Ralene Bathe, PharmD, [place] [phone]

## 2019-01-24 NOTE — Patient Outreach (Signed)
[place] [facility]) Care Management  01/24/2019  Jimmy Le [birth year] [id number]   RN Health coach sent a welcome letter, Advance Directive packet and HTA packet as a Benefit of [employer] health plan.   Plan: Next follow up outreach within 14 business days  [place] Management [phone]

## 2019-02-03 ENCOUNTER — Other Ambulatory Visit: Payer: Self-pay | Admitting: *Deleted

## 2019-02-03 NOTE — Patient Outreach (Signed)
[facility]) Care Management  02/03/2019   Jimmy Le November 13, 1955 [id number]  RN Health Coach telephone call to patient.  Hipaa compliance verified. Per patient he was diagnosed around 6 years ago. Patient is not currently on any inhalers or nebulizer treatments. Patient stated that he is currently still smoking. Patient has an occasional cough. Patient has shortness of breath on moderate exertion. Patient ambulates with a walker. Per patient he has some neuropathy iin his feet. Patient does not drive unless he has not alternative. He still has hisi drivers license and can drive. Patient has not had a fall recently. Patient eats 3 meals a day. Per patient supper is his biggest meal. Patient is morbidly obese. RN will work with portion control next outreach. Patient has agreed to follow up outreach calls.    Current Medications:  Current Outpatient Medications  Medication Sig Dispense Refill  . ARIPiprazole (ABILIFY) 5 MG tablet Take 5 mg by mouth daily.     Marland Kitchen aspirin 81 MG tablet Take 81 mg by mouth daily.    Marland Kitchen aspirin EC 325 MG tablet Take 650 mg by mouth daily as needed for mild pain or moderate pain.    . calcium carbonate (TUMS - DOSED IN MG ELEMENTAL CALCIUM) 500 MG chewable tablet Chew 1 tablet by mouth daily as needed for indigestion or heartburn.    . clonazePAM (KLONOPIN) 0.5 MG tablet Take 0.5 mg by mouth 2 (two) times daily.    Marland Kitchen escitalopram (LEXAPRO) 20 MG tablet Take 20 mg by mouth daily.    Marland Kitchen omeprazole (PRILOSEC) 20 MG capsule Take 20 mg by mouth daily.    Marland Kitchen oxybutynin (DITROPAN) 5 MG tablet Take 5 mg by mouth as directed. EVERY 5 TO 6 HOURS     No current facility-administered medications for this visit.     Functional Status:  In your present state of health, do you have any difficulty performing the following activities: 02/03/2019  Hearing? N  Vision? N  Difficulty concentrating or making decisions? N  Walking or climbing stairs? Y  Comment Patient  ambulates with a walker/ Also patient gets shor of breath on exertion  Dressing or bathing? N  Doing errands, shopping? Y  Comment daughter takes patient he tries not to drive due to neuropathy in his Training and development officer and eating ? N  Using the Toilet? N  In the past six months, have you accidently leaked urine? Y  Do you have problems with loss of bowel control? N  Managing your Medications? N  Managing your Finances? N  Housekeeping or managing your Housekeeping? Y  Comment family takes care of home.  Some recent data might be hidden    Fall/Depression Screening: Fall Risk  02/03/2019  Falls in the past year? 1  Number falls in past yr: 0  Injury with Fall? 0  Risk for fall due to : History of fall(s);Impaired balance/gait;Impaired mobility  Follow up Falls evaluation completed;Falls prevention discussed   PHQ 2/9 Scores 02/03/2019  PHQ - 2 Score 0   THN CM Care Plan Problem One     Most Recent Value  Care Plan Problem One  Knoweledge Deficit in Self Management of COPD  Role Documenting the Problem One  Health Coach  Care Plan for Problem One  Active  THN Long Term Goal   Patient wil not have any readmission for COPD within the next 90 dayst  Interventions for Problem One Long Term Goal  RN discussed COPD exacerbation.  Patient is not on any inhalers. Patient coughs and deep breaths. RN will follow up with educational materia and further discussion  THN CM Short Term Goal #1   Patient will have a better understanding of an eating plan for COPD patient within the next 30 days  THN CM Short Term Goal #1 Start Date  02/03/19  Interventions for Short Term Goal #1  RN discussed eating frequent small meals. RN sent patient educational material on Eating plan for COPD . RN will follow up with further discussion  THN CM Short Term Goal #2   Patient will verbalize having a better understanding of COPD  and physical activity within the next 30 days  THN CM Short Term Goal #2 Start Date   02/03/19  Interventions for Short Term Goal #2  RN discussed  patient and physicial. RN sent educational material on physical activity. RN will follow up with further discussion  THN CM Short Term Goal #3  Patient will verbalize receiving information on smoking cessation within the next 30 days  THN CM Short Term Goal #3 Start Date  02/03/19  Interventions for Short Tern Goal #3  Patient stated he is still smoking. RN sent educational material on quit smoking. RN will follow up for further discussion       Assessment:  Patient is not on any inhalers or nebulizer Patient is currently smoking Patient is morbidly obese Patient will benefit from [place] telephonic outreach for education and support for COPD self management.  Plan:  RN discussed COPD exacerbation RN discussed Coronavirus safety precautions Patient has adequate food supply during pandemic Patient has medication during pandemic RN sent patient educational material on smoking cessation RN sent patient educational material on COPD and physical activity RN sent educational material on COPD and eating plan RN sent barriers letter and assessment to PCP RN will follow up within the month of August   [facility] [name] Management [phone]

## 2019-03-06 DIAGNOSIS — F3341 Major depressive disorder, recurrent, in partial remission: Secondary | ICD-10-CM | POA: Diagnosis not present

## 2019-03-14 ENCOUNTER — Other Ambulatory Visit: Payer: Self-pay | Admitting: *Deleted

## 2019-03-14 NOTE — Patient Outreach (Signed)
[place] [facility]) Care Management  03/14/2019  Jimmy Le 03-21-1956 [id number]   RN Health Coach is closing this program. Consumer is enrolled in [facility] external program.  [place] Care Management [phone]

## 2019-05-06 ENCOUNTER — Ambulatory Visit: Payer: Self-pay | Admitting: *Deleted

## 2019-06-05 DIAGNOSIS — G609 Hereditary and idiopathic neuropathy, unspecified: Secondary | ICD-10-CM | POA: Diagnosis not present

## 2019-06-05 DIAGNOSIS — M1990 Unspecified osteoarthritis, unspecified site: Secondary | ICD-10-CM | POA: Diagnosis not present

## 2019-06-05 DIAGNOSIS — E782 Mixed hyperlipidemia: Secondary | ICD-10-CM | POA: Diagnosis not present

## 2019-06-05 DIAGNOSIS — R7303 Prediabetes: Secondary | ICD-10-CM | POA: Diagnosis not present

## 2019-06-05 DIAGNOSIS — Z23 Encounter for immunization: Secondary | ICD-10-CM | POA: Diagnosis not present

## 2019-06-05 DIAGNOSIS — Z125 Encounter for screening for malignant neoplasm of prostate: Secondary | ICD-10-CM | POA: Diagnosis not present

## 2019-06-05 DIAGNOSIS — F418 Other specified anxiety disorders: Secondary | ICD-10-CM | POA: Diagnosis not present

## 2019-06-05 DIAGNOSIS — R350 Frequency of micturition: Secondary | ICD-10-CM | POA: Diagnosis not present

## 2019-06-05 DIAGNOSIS — Z720 Tobacco use: Secondary | ICD-10-CM | POA: Diagnosis not present

## 2019-06-05 DIAGNOSIS — Z87891 Personal history of nicotine dependence: Secondary | ICD-10-CM | POA: Diagnosis not present

## 2019-06-05 DIAGNOSIS — J982 Interstitial emphysema: Secondary | ICD-10-CM | POA: Diagnosis not present

## 2019-06-05 DIAGNOSIS — Z1339 Encounter for screening examination for other mental health and behavioral disorders: Secondary | ICD-10-CM | POA: Diagnosis not present

## 2019-06-13 DIAGNOSIS — F17210 Nicotine dependence, cigarettes, uncomplicated: Secondary | ICD-10-CM | POA: Diagnosis not present

## 2019-06-13 DIAGNOSIS — I700 Atherosclerosis of aorta: Secondary | ICD-10-CM | POA: Diagnosis not present

## 2019-06-13 DIAGNOSIS — I2510 Atherosclerotic heart disease of native coronary artery without angina pectoris: Secondary | ICD-10-CM | POA: Diagnosis not present

## 2019-06-13 DIAGNOSIS — J920 Pleural plaque with presence of asbestos: Secondary | ICD-10-CM | POA: Diagnosis not present

## 2019-09-03 DIAGNOSIS — H26493 Other secondary cataract, bilateral: Secondary | ICD-10-CM | POA: Diagnosis not present

## 2019-09-04 DIAGNOSIS — F3341 Major depressive disorder, recurrent, in partial remission: Secondary | ICD-10-CM | POA: Diagnosis not present

## 2019-09-11 DIAGNOSIS — Z125 Encounter for screening for malignant neoplasm of prostate: Secondary | ICD-10-CM | POA: Diagnosis not present

## 2019-09-11 DIAGNOSIS — Z9181 History of falling: Secondary | ICD-10-CM | POA: Diagnosis not present

## 2019-09-11 DIAGNOSIS — Z1331 Encounter for screening for depression: Secondary | ICD-10-CM | POA: Diagnosis not present

## 2019-09-11 DIAGNOSIS — E785 Hyperlipidemia, unspecified: Secondary | ICD-10-CM | POA: Diagnosis not present

## 2019-09-11 DIAGNOSIS — Z6837 Body mass index (BMI) 37.0-37.9, adult: Secondary | ICD-10-CM | POA: Diagnosis not present

## 2019-09-11 DIAGNOSIS — Z0000 Encounter for general adult medical examination without abnormal findings: Secondary | ICD-10-CM | POA: Diagnosis not present

## 2020-09-29 DIAGNOSIS — G8310 Monoplegia of lower limb affecting unspecified side: Secondary | ICD-10-CM | POA: Diagnosis not present

## 2020-09-29 DIAGNOSIS — L89150 Pressure ulcer of sacral region, unstageable: Secondary | ICD-10-CM | POA: Diagnosis not present

## 2020-09-29 DIAGNOSIS — K922 Gastrointestinal hemorrhage, unspecified: Secondary | ICD-10-CM | POA: Diagnosis not present

## 2020-09-29 DIAGNOSIS — D649 Anemia, unspecified: Secondary | ICD-10-CM | POA: Diagnosis not present

## 2020-09-29 DIAGNOSIS — I700 Atherosclerosis of aorta: Secondary | ICD-10-CM | POA: Diagnosis not present

## 2020-09-29 DIAGNOSIS — J982 Interstitial emphysema: Secondary | ICD-10-CM | POA: Diagnosis not present

## 2020-10-07 DIAGNOSIS — J982 Interstitial emphysema: Secondary | ICD-10-CM | POA: Diagnosis not present

## 2020-10-07 DIAGNOSIS — R350 Frequency of micturition: Secondary | ICD-10-CM | POA: Diagnosis not present

## 2020-10-07 DIAGNOSIS — D509 Iron deficiency anemia, unspecified: Secondary | ICD-10-CM | POA: Diagnosis not present

## 2020-10-07 DIAGNOSIS — L89310 Pressure ulcer of right buttock, unstageable: Secondary | ICD-10-CM | POA: Diagnosis not present

## 2020-10-07 DIAGNOSIS — M1991 Primary osteoarthritis, unspecified site: Secondary | ICD-10-CM | POA: Diagnosis not present

## 2020-10-07 DIAGNOSIS — L89320 Pressure ulcer of left buttock, unstageable: Secondary | ICD-10-CM | POA: Diagnosis not present

## 2020-10-07 DIAGNOSIS — I700 Atherosclerosis of aorta: Secondary | ICD-10-CM | POA: Diagnosis not present

## 2020-10-07 DIAGNOSIS — G8220 Paraplegia, unspecified: Secondary | ICD-10-CM | POA: Diagnosis not present

## 2020-10-07 DIAGNOSIS — Z9181 History of falling: Secondary | ICD-10-CM | POA: Diagnosis not present

## 2020-10-07 DIAGNOSIS — F17210 Nicotine dependence, cigarettes, uncomplicated: Secondary | ICD-10-CM | POA: Diagnosis not present

## 2020-10-08 DIAGNOSIS — J982 Interstitial emphysema: Secondary | ICD-10-CM | POA: Diagnosis not present

## 2020-10-08 DIAGNOSIS — G8310 Monoplegia of lower limb affecting unspecified side: Secondary | ICD-10-CM | POA: Diagnosis not present

## 2020-10-08 DIAGNOSIS — L89150 Pressure ulcer of sacral region, unstageable: Secondary | ICD-10-CM | POA: Diagnosis not present

## 2020-10-14 DIAGNOSIS — M1991 Primary osteoarthritis, unspecified site: Secondary | ICD-10-CM | POA: Diagnosis not present

## 2020-10-14 DIAGNOSIS — R350 Frequency of micturition: Secondary | ICD-10-CM | POA: Diagnosis not present

## 2020-10-14 DIAGNOSIS — J982 Interstitial emphysema: Secondary | ICD-10-CM | POA: Diagnosis not present

## 2020-10-14 DIAGNOSIS — I700 Atherosclerosis of aorta: Secondary | ICD-10-CM | POA: Diagnosis not present

## 2020-10-14 DIAGNOSIS — G8220 Paraplegia, unspecified: Secondary | ICD-10-CM | POA: Diagnosis not present

## 2020-10-14 DIAGNOSIS — L89310 Pressure ulcer of right buttock, unstageable: Secondary | ICD-10-CM | POA: Diagnosis not present

## 2020-10-14 DIAGNOSIS — L89320 Pressure ulcer of left buttock, unstageable: Secondary | ICD-10-CM | POA: Diagnosis not present

## 2020-10-14 DIAGNOSIS — F17210 Nicotine dependence, cigarettes, uncomplicated: Secondary | ICD-10-CM | POA: Diagnosis not present

## 2020-10-14 DIAGNOSIS — D509 Iron deficiency anemia, unspecified: Secondary | ICD-10-CM | POA: Diagnosis not present

## 2020-10-14 DIAGNOSIS — Z9181 History of falling: Secondary | ICD-10-CM | POA: Diagnosis not present

## 2020-10-15 DIAGNOSIS — I700 Atherosclerosis of aorta: Secondary | ICD-10-CM | POA: Diagnosis not present

## 2020-10-15 DIAGNOSIS — L89310 Pressure ulcer of right buttock, unstageable: Secondary | ICD-10-CM | POA: Diagnosis not present

## 2020-10-15 DIAGNOSIS — R350 Frequency of micturition: Secondary | ICD-10-CM | POA: Diagnosis not present

## 2020-10-15 DIAGNOSIS — F17210 Nicotine dependence, cigarettes, uncomplicated: Secondary | ICD-10-CM | POA: Diagnosis not present

## 2020-10-15 DIAGNOSIS — J982 Interstitial emphysema: Secondary | ICD-10-CM | POA: Diagnosis not present

## 2020-10-15 DIAGNOSIS — L89320 Pressure ulcer of left buttock, unstageable: Secondary | ICD-10-CM | POA: Diagnosis not present

## 2020-10-15 DIAGNOSIS — G8220 Paraplegia, unspecified: Secondary | ICD-10-CM | POA: Diagnosis not present

## 2020-10-15 DIAGNOSIS — M1991 Primary osteoarthritis, unspecified site: Secondary | ICD-10-CM | POA: Diagnosis not present

## 2020-10-15 DIAGNOSIS — Z9181 History of falling: Secondary | ICD-10-CM | POA: Diagnosis not present

## 2020-10-15 DIAGNOSIS — D509 Iron deficiency anemia, unspecified: Secondary | ICD-10-CM | POA: Diagnosis not present

## 2020-10-20 DIAGNOSIS — L89320 Pressure ulcer of left buttock, unstageable: Secondary | ICD-10-CM | POA: Diagnosis not present

## 2020-10-20 DIAGNOSIS — Z9181 History of falling: Secondary | ICD-10-CM | POA: Diagnosis not present

## 2020-10-20 DIAGNOSIS — M1991 Primary osteoarthritis, unspecified site: Secondary | ICD-10-CM | POA: Diagnosis not present

## 2020-10-20 DIAGNOSIS — G8220 Paraplegia, unspecified: Secondary | ICD-10-CM | POA: Diagnosis not present

## 2020-10-20 DIAGNOSIS — L89310 Pressure ulcer of right buttock, unstageable: Secondary | ICD-10-CM | POA: Diagnosis not present

## 2020-10-20 DIAGNOSIS — R350 Frequency of micturition: Secondary | ICD-10-CM | POA: Diagnosis not present

## 2020-10-20 DIAGNOSIS — I700 Atherosclerosis of aorta: Secondary | ICD-10-CM | POA: Diagnosis not present

## 2020-10-20 DIAGNOSIS — D509 Iron deficiency anemia, unspecified: Secondary | ICD-10-CM | POA: Diagnosis not present

## 2020-10-20 DIAGNOSIS — J982 Interstitial emphysema: Secondary | ICD-10-CM | POA: Diagnosis not present

## 2020-10-20 DIAGNOSIS — F17210 Nicotine dependence, cigarettes, uncomplicated: Secondary | ICD-10-CM | POA: Diagnosis not present

## 2020-10-25 DIAGNOSIS — L89310 Pressure ulcer of right buttock, unstageable: Secondary | ICD-10-CM | POA: Diagnosis not present

## 2020-10-25 DIAGNOSIS — Z5181 Encounter for therapeutic drug level monitoring: Secondary | ICD-10-CM | POA: Diagnosis not present

## 2020-10-25 DIAGNOSIS — J982 Interstitial emphysema: Secondary | ICD-10-CM | POA: Diagnosis not present

## 2020-10-25 DIAGNOSIS — F17210 Nicotine dependence, cigarettes, uncomplicated: Secondary | ICD-10-CM | POA: Diagnosis not present

## 2020-10-25 DIAGNOSIS — R350 Frequency of micturition: Secondary | ICD-10-CM | POA: Diagnosis not present

## 2020-10-25 DIAGNOSIS — D509 Iron deficiency anemia, unspecified: Secondary | ICD-10-CM | POA: Diagnosis not present

## 2020-10-25 DIAGNOSIS — E559 Vitamin D deficiency, unspecified: Secondary | ICD-10-CM | POA: Diagnosis not present

## 2020-10-25 DIAGNOSIS — D500 Iron deficiency anemia secondary to blood loss (chronic): Secondary | ICD-10-CM | POA: Diagnosis not present

## 2020-10-25 DIAGNOSIS — I700 Atherosclerosis of aorta: Secondary | ICD-10-CM | POA: Diagnosis not present

## 2020-10-25 DIAGNOSIS — G8220 Paraplegia, unspecified: Secondary | ICD-10-CM | POA: Diagnosis not present

## 2020-10-25 DIAGNOSIS — Z9181 History of falling: Secondary | ICD-10-CM | POA: Diagnosis not present

## 2020-10-25 DIAGNOSIS — Z79899 Other long term (current) drug therapy: Secondary | ICD-10-CM | POA: Diagnosis not present

## 2020-10-25 DIAGNOSIS — L89320 Pressure ulcer of left buttock, unstageable: Secondary | ICD-10-CM | POA: Diagnosis not present

## 2020-10-25 DIAGNOSIS — M1991 Primary osteoarthritis, unspecified site: Secondary | ICD-10-CM | POA: Diagnosis not present

## 2020-11-02 DIAGNOSIS — F17210 Nicotine dependence, cigarettes, uncomplicated: Secondary | ICD-10-CM | POA: Diagnosis not present

## 2020-11-02 DIAGNOSIS — I700 Atherosclerosis of aorta: Secondary | ICD-10-CM | POA: Diagnosis not present

## 2020-11-02 DIAGNOSIS — L89310 Pressure ulcer of right buttock, unstageable: Secondary | ICD-10-CM | POA: Diagnosis not present

## 2020-11-02 DIAGNOSIS — J982 Interstitial emphysema: Secondary | ICD-10-CM | POA: Diagnosis not present

## 2020-11-02 DIAGNOSIS — L89320 Pressure ulcer of left buttock, unstageable: Secondary | ICD-10-CM | POA: Diagnosis not present

## 2020-11-02 DIAGNOSIS — Z9181 History of falling: Secondary | ICD-10-CM | POA: Diagnosis not present

## 2020-11-02 DIAGNOSIS — D509 Iron deficiency anemia, unspecified: Secondary | ICD-10-CM | POA: Diagnosis not present

## 2020-11-02 DIAGNOSIS — R350 Frequency of micturition: Secondary | ICD-10-CM | POA: Diagnosis not present

## 2020-11-02 DIAGNOSIS — M1991 Primary osteoarthritis, unspecified site: Secondary | ICD-10-CM | POA: Diagnosis not present

## 2020-11-02 DIAGNOSIS — G8220 Paraplegia, unspecified: Secondary | ICD-10-CM | POA: Diagnosis not present

## 2020-11-08 DIAGNOSIS — J982 Interstitial emphysema: Secondary | ICD-10-CM | POA: Diagnosis not present

## 2020-11-08 DIAGNOSIS — G8310 Monoplegia of lower limb affecting unspecified side: Secondary | ICD-10-CM | POA: Diagnosis not present

## 2020-11-08 DIAGNOSIS — L89150 Pressure ulcer of sacral region, unstageable: Secondary | ICD-10-CM | POA: Diagnosis not present

## 2020-11-09 DIAGNOSIS — K922 Gastrointestinal hemorrhage, unspecified: Secondary | ICD-10-CM | POA: Diagnosis not present

## 2020-11-09 DIAGNOSIS — Z9181 History of falling: Secondary | ICD-10-CM | POA: Diagnosis not present

## 2020-11-09 DIAGNOSIS — J982 Interstitial emphysema: Secondary | ICD-10-CM | POA: Diagnosis not present

## 2020-11-09 DIAGNOSIS — G8310 Monoplegia of lower limb affecting unspecified side: Secondary | ICD-10-CM | POA: Diagnosis not present

## 2020-11-09 DIAGNOSIS — I700 Atherosclerosis of aorta: Secondary | ICD-10-CM | POA: Diagnosis not present

## 2020-11-09 DIAGNOSIS — D649 Anemia, unspecified: Secondary | ICD-10-CM | POA: Diagnosis not present

## 2020-11-09 DIAGNOSIS — L89150 Pressure ulcer of sacral region, unstageable: Secondary | ICD-10-CM | POA: Diagnosis not present

## 2020-11-10 ENCOUNTER — Other Ambulatory Visit: Payer: Self-pay | Admitting: *Deleted

## 2020-11-10 ENCOUNTER — Encounter: Payer: Self-pay | Admitting: *Deleted

## 2020-11-10 DIAGNOSIS — L89320 Pressure ulcer of left buttock, unstageable: Secondary | ICD-10-CM | POA: Diagnosis not present

## 2020-11-10 DIAGNOSIS — G8220 Paraplegia, unspecified: Secondary | ICD-10-CM | POA: Diagnosis not present

## 2020-11-10 DIAGNOSIS — I700 Atherosclerosis of aorta: Secondary | ICD-10-CM | POA: Diagnosis not present

## 2020-11-10 DIAGNOSIS — J982 Interstitial emphysema: Secondary | ICD-10-CM | POA: Diagnosis not present

## 2020-11-10 DIAGNOSIS — M1991 Primary osteoarthritis, unspecified site: Secondary | ICD-10-CM | POA: Diagnosis not present

## 2020-11-10 DIAGNOSIS — Z9181 History of falling: Secondary | ICD-10-CM | POA: Diagnosis not present

## 2020-11-10 DIAGNOSIS — F17210 Nicotine dependence, cigarettes, uncomplicated: Secondary | ICD-10-CM | POA: Diagnosis not present

## 2020-11-10 DIAGNOSIS — L89310 Pressure ulcer of right buttock, unstageable: Secondary | ICD-10-CM | POA: Diagnosis not present

## 2020-11-10 DIAGNOSIS — D509 Iron deficiency anemia, unspecified: Secondary | ICD-10-CM | POA: Diagnosis not present

## 2020-11-10 DIAGNOSIS — R350 Frequency of micturition: Secondary | ICD-10-CM | POA: Diagnosis not present

## 2020-11-11 ENCOUNTER — Encounter: Payer: Self-pay | Admitting: Neurology

## 2020-11-11 ENCOUNTER — Telehealth: Payer: Self-pay | Admitting: Neurology

## 2020-11-11 ENCOUNTER — Ambulatory Visit: Payer: Medicare Other | Admitting: Neurology

## 2020-11-11 ENCOUNTER — Other Ambulatory Visit: Payer: Self-pay

## 2020-11-11 VITALS — BP 134/67 | HR 66

## 2020-11-11 DIAGNOSIS — R202 Paresthesia of skin: Secondary | ICD-10-CM

## 2020-11-11 DIAGNOSIS — R531 Weakness: Secondary | ICD-10-CM

## 2020-11-11 DIAGNOSIS — R269 Unspecified abnormalities of gait and mobility: Secondary | ICD-10-CM | POA: Diagnosis not present

## 2020-11-11 NOTE — Telephone Encounter (Signed)
[employer] medicare order sent to [facility]. No auth they will reach out to the patient to schedule.  

## 2020-11-11 NOTE — Progress Notes (Signed)
Chief Complaint  Patient presents with   Idiopathic Neuropathy    Rm 16 New Pt  wife- Randell Loop "getting weaker in my legs, worse from knees down, no feeling in my feet, no pain, calves feel like they are asleep, unable to stand on my own"    HISTORICAL  Jimmy Le is a 65 year old male, seen in request by his primary care nurse practitioner Charlott Holler for evaluation of difficulty walking, lower extremity weakness, initial evaluation was on November 11, 2020  I reviewed and summarized the referring note.PMHX. Depression/anxiety, Abilify COPD, smoke 1ppd,  Obesity Upper GI bleeding in December 2021, hemoglobin dropped to 7.7 required blood transfusion  He went on disability since 2014 because of bilateral lower extremity weakness, sensory loss, he previously worked as a Administrator  Around 4496, he began to notice gradual onset bilateral feet numbness tingling, gradually work his way up, per patient, he was seen by neurologist at [facility], was diagnosed with neuropathy, but no treatment was offered, continue have worsening ascending paresthesia, worsening lower extremity weakness, gait abnormality, noted numbness to the mid spine level,  He also develop bilateral fingertips paresthesia, extending to bilateral wrist level  He denies significant neck, low back pain, denies bowel bladder incontinence  Laboratory evaluation in December 2021, CBC showed hemoglobin of 7.7, upper GI bleeding, received blood transfusion. Creat 1.0 stool occult blood was positive.  A1c was 6.4  REVIEW OF SYSTEMS: Full 14 system review of systems performed and notable only for as above All other review of systems were negative.  ALLERGIES: No Known Allergies  HOME MEDICATIONS: Current Outpatient Medications  Medication Sig Dispense Refill   ARIPiprazole (ABILIFY) 10 MG tablet Take 10 mg by mouth at bedtime.     clonazePAM (KLONOPIN) 0.5 MG tablet Take 0.5 mg by mouth 2 (two) times daily.      escitalopram (LEXAPRO) 20 MG tablet Take 20 mg by mouth daily.     ferrous sulfate 325 (65 FE) MG tablet Take 325 mg by mouth daily.     oxybutynin (DITROPAN) 5 MG tablet Take 5 mg by mouth as directed. EVERY 5 TO 6 HOURS     pantoprazole (PROTONIX) 20 MG tablet Take 20 mg by mouth daily.     aspirin 81 MG tablet Take 81 mg by mouth daily. (Patient not taking: Reported on 11/11/2020)     aspirin EC 325 MG tablet Take 650 mg by mouth daily as needed for mild pain or moderate pain. (Patient not taking: Reported on 11/11/2020)     brexpiprazole (REXULTI) 1 MG TABS tablet Take by mouth daily. (Patient not taking: Reported on 11/11/2020)     calcium carbonate (TUMS - DOSED IN MG ELEMENTAL CALCIUM) 500 MG chewable tablet Chew 1 tablet by mouth daily as needed for indigestion or heartburn. (Patient not taking: Reported on 11/11/2020)     No current facility-administered medications for this visit.    PAST MEDICAL HISTORY: Past Medical History:  Diagnosis Date   Anemia    Anxiety and depression    Arthritis    Atherosclerosis of aorta (HCC)    COPD (chronic obstructive pulmonary disease) (HCC)    Dyspnea    Emphysema lung (HCC)    HOH (hard of hearing)    Idiopathic peripheral neuropathy    MRSA infection    H/O   Neuropathy    Osteoarthritis    Paresis of lower extremity (HCC)    Tremors of nervous system    Wheezing  PAST SURGICAL HISTORY: Past Surgical History:  Procedure Laterality Date   APPENDECTOMY  1966   CATARACT EXTRACTION W/PHACO Left 05/10/2017   Procedure: CATARACT EXTRACTION PHACO AND INTRAOCULAR LENS PLACEMENT ([place]);  Surgeon: Eulogio Bear, MD;  Location: [facility] ORS;  Service: Ophthalmology;  Laterality: Left;  [place] 00:24.4 AP% 7.0 CDE 1.72 FLUID PACK LOT # [id number]   CATARACT EXTRACTION W/PHACO Right 06/07/2017   Procedure: CATARACT EXTRACTION PHACO AND INTRAOCULAR LENS PLACEMENT ([facility]);  Surgeon: Eulogio Bear, MD;  Location: [facility] ORS;   Service: Ophthalmology;  Laterality: Right;  [place] 00:22.6 AP% 9.2 CDE 2.07 FLUID PACK LOT # [id number]   COLONOSCOPY     FOOT SURGERY Bilateral 2010   HERNIA REPAIR  1995   RCR Right 2010    FAMILY HISTORY: Family History  Problem Relation Age of Onset   Hepatitis Mother    Hyperlipidemia Mother    Hypertension Mother    Prostate cancer Father    Stroke Father    COPD Brother    Emphysema Brother    Hypertension Brother    Heart attack Brother    Emphysema Maternal Grandmother    Stroke Maternal Grandmother    Hyperlipidemia Maternal Grandmother    Hypertension Maternal Grandmother    Heart attack Maternal Grandmother    Hyperlipidemia Paternal Grandmother    Arthritis Paternal Grandmother     SOCIAL HISTORY: Social History   Socioeconomic History   Marital status: Married    Spouse name: Copywriter, advertising   Number of children: 3   Years of education: Not on file   Highest education level: High school graduate  Occupational History   Not on file  Tobacco Use   Smoking status: Current Every Day Smoker    Packs/day: 0.75    Years: 45.00    Pack years: 33.75   Smokeless tobacco: Never Used  Substance and Sexual Activity   Alcohol use: No   Drug use: Never   Sexual activity: Not on file  Other Topics Concern   Not on file  Social History Narrative   Lives with wife   Caffeine- coffee 2 c daily   Social Determinants of Health   Financial Resource Strain: Not on file  Food Insecurity: Not on file  Transportation Needs: Not on file  Physical Activity: Not on file  Stress: Not on file  Social Connections: Not on file  Intimate Partner Violence: Not on file     PHYSICAL EXAM   Vitals:   11/11/20 1051  BP: 134/67  Pulse: 66   Not recorded     There is no height or weight on file to calculate BMI.  PHYSICAL EXAMNIATION:  Gen: NAD, conversant, well nourised, well groomed                     Cardiovascular: Regular rate rhythm,  no peripheral edema, warm, nontender. Eyes: Conjunctivae clear without exudates or hemorrhage Neck: Supple, no carotid bruits. Pulmonary: Clear to auscultation bilaterally   NEUROLOGICAL EXAM:  MENTAL STATUS: Speech:    Speech is normal; fluent and spontaneous with normal comprehension.  Cognition:     Orientation to time, place and person     Normal recent and remote memory     Normal Attention span and concentration     Normal Language, naming, repeating,spontaneous speech     Fund of knowledge   CRANIAL NERVES: CN II: Visual fields are full to confrontation. Pupils are round equal and briskly reactive to light. CN III,  IV, VI: extraocular movement are normal. No ptosis. CN V: Facial sensation is intact to light touch CN VII: Face is symmetric with normal eye closure  CN VIII: Hearing is normal to causal conversation. CN IX, X: Phonation is normal. CN XI: Head turning and shoulder shrug are intact  MOTOR: Mild bilateral hand intrinsic muscle atrophy, mild bilateral finger abduction weakness, no proximal upper extremity weakness, no significant bilateral lower extremity proximal muscle weakness, mild bilateral ankle dorsiflexion weakness,  REFLEXES: Reflexes are 2+ and symmetric at the biceps, triceps, knees, and absent at ankles. Plantar responses are flexor.  SENSORY: Length dependent decreased light touch, vibratory sensation, pinprick to mid thigh level, decreased pinprick to elbow level bilaterally  COORDINATION: There is no trunk or limb dysmetria noted.  GAIT/STANCE: Deferred  DIAGNOSTIC DATA (LABS, IMAGING, TESTING) - I reviewed patient records, labs, notes, testing and imaging myself where available.   ASSESSMENT AND PLAN  Ward Boissonneault is a 65 y.o. male   Slow progressive ascending paresthesia, gait abnormality, with evidence of distal upper and lower extremity weakness  He does have length dependent sensory changes, but also with well-preserved bilateral upper  extremity and patellar reflex  Differentiation diagnosis including small fiber neuropathy, also need to rule out the possibility of cervical spondylitic myelopathy, lumbar radiculopathy  MRI of cervical, lumbar spine  EMG nerve conduction study  Bring laboratory evaluation from his primary care physician  Marcial Pacas, M.D. Ph.D.  [facility] [address] [place], [place] [address] Ph: [phone] Fax: [phone]  CC:  Philmore Pali, NP [place],  [place] [address]

## 2020-11-12 DIAGNOSIS — Z9181 History of falling: Secondary | ICD-10-CM | POA: Diagnosis not present

## 2020-11-12 DIAGNOSIS — L89320 Pressure ulcer of left buttock, unstageable: Secondary | ICD-10-CM | POA: Diagnosis not present

## 2020-11-12 DIAGNOSIS — I700 Atherosclerosis of aorta: Secondary | ICD-10-CM | POA: Diagnosis not present

## 2020-11-12 DIAGNOSIS — F17210 Nicotine dependence, cigarettes, uncomplicated: Secondary | ICD-10-CM | POA: Diagnosis not present

## 2020-11-12 DIAGNOSIS — R350 Frequency of micturition: Secondary | ICD-10-CM | POA: Diagnosis not present

## 2020-11-12 DIAGNOSIS — L89310 Pressure ulcer of right buttock, unstageable: Secondary | ICD-10-CM | POA: Diagnosis not present

## 2020-11-12 DIAGNOSIS — J982 Interstitial emphysema: Secondary | ICD-10-CM | POA: Diagnosis not present

## 2020-11-12 DIAGNOSIS — G8220 Paraplegia, unspecified: Secondary | ICD-10-CM | POA: Diagnosis not present

## 2020-11-12 DIAGNOSIS — M1991 Primary osteoarthritis, unspecified site: Secondary | ICD-10-CM | POA: Diagnosis not present

## 2020-11-12 DIAGNOSIS — D509 Iron deficiency anemia, unspecified: Secondary | ICD-10-CM | POA: Diagnosis not present

## 2020-11-15 DIAGNOSIS — M1991 Primary osteoarthritis, unspecified site: Secondary | ICD-10-CM | POA: Diagnosis not present

## 2020-11-15 DIAGNOSIS — D509 Iron deficiency anemia, unspecified: Secondary | ICD-10-CM | POA: Diagnosis not present

## 2020-11-15 DIAGNOSIS — I700 Atherosclerosis of aorta: Secondary | ICD-10-CM | POA: Diagnosis not present

## 2020-11-15 DIAGNOSIS — G8220 Paraplegia, unspecified: Secondary | ICD-10-CM | POA: Diagnosis not present

## 2020-11-15 DIAGNOSIS — Z9181 History of falling: Secondary | ICD-10-CM | POA: Diagnosis not present

## 2020-11-15 DIAGNOSIS — F17210 Nicotine dependence, cigarettes, uncomplicated: Secondary | ICD-10-CM | POA: Diagnosis not present

## 2020-11-15 DIAGNOSIS — L89310 Pressure ulcer of right buttock, unstageable: Secondary | ICD-10-CM | POA: Diagnosis not present

## 2020-11-15 DIAGNOSIS — L89320 Pressure ulcer of left buttock, unstageable: Secondary | ICD-10-CM | POA: Diagnosis not present

## 2020-11-15 DIAGNOSIS — J982 Interstitial emphysema: Secondary | ICD-10-CM | POA: Diagnosis not present

## 2020-11-15 DIAGNOSIS — R350 Frequency of micturition: Secondary | ICD-10-CM | POA: Diagnosis not present

## 2020-11-16 DIAGNOSIS — L89310 Pressure ulcer of right buttock, unstageable: Secondary | ICD-10-CM | POA: Diagnosis not present

## 2020-11-16 DIAGNOSIS — Z9181 History of falling: Secondary | ICD-10-CM | POA: Diagnosis not present

## 2020-11-16 DIAGNOSIS — R350 Frequency of micturition: Secondary | ICD-10-CM | POA: Diagnosis not present

## 2020-11-16 DIAGNOSIS — L89320 Pressure ulcer of left buttock, unstageable: Secondary | ICD-10-CM | POA: Diagnosis not present

## 2020-11-16 DIAGNOSIS — G8220 Paraplegia, unspecified: Secondary | ICD-10-CM | POA: Diagnosis not present

## 2020-11-16 DIAGNOSIS — J982 Interstitial emphysema: Secondary | ICD-10-CM | POA: Diagnosis not present

## 2020-11-16 DIAGNOSIS — M1991 Primary osteoarthritis, unspecified site: Secondary | ICD-10-CM | POA: Diagnosis not present

## 2020-11-16 DIAGNOSIS — I700 Atherosclerosis of aorta: Secondary | ICD-10-CM | POA: Diagnosis not present

## 2020-11-16 DIAGNOSIS — D509 Iron deficiency anemia, unspecified: Secondary | ICD-10-CM | POA: Diagnosis not present

## 2020-11-16 DIAGNOSIS — F17210 Nicotine dependence, cigarettes, uncomplicated: Secondary | ICD-10-CM | POA: Diagnosis not present

## 2020-11-17 DIAGNOSIS — Z9181 History of falling: Secondary | ICD-10-CM | POA: Diagnosis not present

## 2020-11-17 DIAGNOSIS — R350 Frequency of micturition: Secondary | ICD-10-CM | POA: Diagnosis not present

## 2020-11-17 DIAGNOSIS — L89310 Pressure ulcer of right buttock, unstageable: Secondary | ICD-10-CM | POA: Diagnosis not present

## 2020-11-17 DIAGNOSIS — F17210 Nicotine dependence, cigarettes, uncomplicated: Secondary | ICD-10-CM | POA: Diagnosis not present

## 2020-11-17 DIAGNOSIS — L89320 Pressure ulcer of left buttock, unstageable: Secondary | ICD-10-CM | POA: Diagnosis not present

## 2020-11-17 DIAGNOSIS — J982 Interstitial emphysema: Secondary | ICD-10-CM | POA: Diagnosis not present

## 2020-11-17 DIAGNOSIS — I700 Atherosclerosis of aorta: Secondary | ICD-10-CM | POA: Diagnosis not present

## 2020-11-17 DIAGNOSIS — G8220 Paraplegia, unspecified: Secondary | ICD-10-CM | POA: Diagnosis not present

## 2020-11-17 DIAGNOSIS — D509 Iron deficiency anemia, unspecified: Secondary | ICD-10-CM | POA: Diagnosis not present

## 2020-11-17 DIAGNOSIS — M1991 Primary osteoarthritis, unspecified site: Secondary | ICD-10-CM | POA: Diagnosis not present

## 2020-11-19 DIAGNOSIS — L89310 Pressure ulcer of right buttock, unstageable: Secondary | ICD-10-CM | POA: Diagnosis not present

## 2020-11-19 DIAGNOSIS — L89320 Pressure ulcer of left buttock, unstageable: Secondary | ICD-10-CM | POA: Diagnosis not present

## 2020-11-19 DIAGNOSIS — Z9181 History of falling: Secondary | ICD-10-CM | POA: Diagnosis not present

## 2020-11-19 DIAGNOSIS — I700 Atherosclerosis of aorta: Secondary | ICD-10-CM | POA: Diagnosis not present

## 2020-11-19 DIAGNOSIS — G8220 Paraplegia, unspecified: Secondary | ICD-10-CM | POA: Diagnosis not present

## 2020-11-19 DIAGNOSIS — F17210 Nicotine dependence, cigarettes, uncomplicated: Secondary | ICD-10-CM | POA: Diagnosis not present

## 2020-11-19 DIAGNOSIS — M1991 Primary osteoarthritis, unspecified site: Secondary | ICD-10-CM | POA: Diagnosis not present

## 2020-11-19 DIAGNOSIS — R350 Frequency of micturition: Secondary | ICD-10-CM | POA: Diagnosis not present

## 2020-11-19 DIAGNOSIS — J982 Interstitial emphysema: Secondary | ICD-10-CM | POA: Diagnosis not present

## 2020-11-19 DIAGNOSIS — D509 Iron deficiency anemia, unspecified: Secondary | ICD-10-CM | POA: Diagnosis not present

## 2020-11-22 ENCOUNTER — Telehealth: Payer: Self-pay | Admitting: Neurology

## 2020-11-22 ENCOUNTER — Ambulatory Visit (INDEPENDENT_AMBULATORY_CARE_PROVIDER_SITE_OTHER): Payer: Medicare Other | Admitting: Neurology

## 2020-11-22 ENCOUNTER — Ambulatory Visit: Payer: Medicare Other | Admitting: Neurology

## 2020-11-22 DIAGNOSIS — E611 Iron deficiency: Secondary | ICD-10-CM | POA: Diagnosis not present

## 2020-11-22 DIAGNOSIS — E538 Deficiency of other specified B group vitamins: Secondary | ICD-10-CM | POA: Diagnosis not present

## 2020-11-22 DIAGNOSIS — R159 Full incontinence of feces: Secondary | ICD-10-CM | POA: Diagnosis not present

## 2020-11-22 DIAGNOSIS — G629 Polyneuropathy, unspecified: Secondary | ICD-10-CM

## 2020-11-22 DIAGNOSIS — R202 Paresthesia of skin: Secondary | ICD-10-CM

## 2020-11-22 DIAGNOSIS — R799 Abnormal finding of blood chemistry, unspecified: Secondary | ICD-10-CM | POA: Diagnosis not present

## 2020-11-22 DIAGNOSIS — R269 Unspecified abnormalities of gait and mobility: Secondary | ICD-10-CM | POA: Diagnosis not present

## 2020-11-22 DIAGNOSIS — R531 Weakness: Secondary | ICD-10-CM

## 2020-11-22 DIAGNOSIS — E559 Vitamin D deficiency, unspecified: Secondary | ICD-10-CM | POA: Diagnosis not present

## 2020-11-22 DIAGNOSIS — R7309 Other abnormal glucose: Secondary | ICD-10-CM | POA: Diagnosis not present

## 2020-11-22 NOTE — Telephone Encounter (Signed)
Noted, order was sent to [facility]. And they have tried to reach out to the patient to schedule.  11/15/2020-2nd attempt/epic/[facility]/lady who picked up could not hear/bcr 11-12-2020 1ST ATTEMPT EPIC ORDER [facility] PHONE LINE BUSY/DP  Ill send it again.

## 2020-11-22 NOTE — Progress Notes (Addendum)
No chief complaint on file.   HISTORICAL  Jimmy Le is a 65 year old male, seen in request by his primary care nurse practitioner Jimmy Le for evaluation of difficulty walking, lower extremity weakness, initial evaluation was on November 11, 2020  I reviewed and summarized the referring note.PMHX. Depression/anxiety, Abilify COPD, smoke 1ppd,  Obesity Upper GI bleeding in December 2021, hemoglobin dropped to 7.7 required blood transfusion  He went on disability since 2014 because of bilateral lower extremity weakness, sensory loss, he previously worked as a Administrator  Around 2637, he began to notice gradual onset bilateral feet numbness tingling, gradually work his way up, per patient, he was seen by neurologist at [facility], was diagnosed with neuropathy, but no treatment was offered, continue have worsening ascending paresthesia, worsening lower extremity weakness, gait abnormality, noted numbness to the mid spine level,  He also develop bilateral fingertips paresthesia, extending to bilateral wrist level  He denies significant neck, low back pain, denies bowel bladder incontinence  Laboratory evaluation in December 2021, CBC showed hemoglobin of 7.7, upper GI bleeding, received blood transfusion. Creat 1.0 stool occult blood was positive.  A1c was 6.4  Update November 22, 2020: He is accompanied by his wife and son at today's visit,  EMG nerve conduction study showed significant abnormality, length dependent sensorimotor polyneuropathy, with mixed demyelinating and axonal features  In addition, he has length dependent weakness, gait abnormality, but brisk reflex involving bilateral upper extremity and patella, MRI of lumbar and cervical spines are pending REVIEW OF SYSTEMS: Full 14 system review of systems performed and notable only for as above All other review of systems were negative.  ALLERGIES: No Known Allergies  HOME MEDICATIONS: Current Outpatient Medications   Medication Sig Dispense Refill  . ARIPiprazole (ABILIFY) 10 MG tablet Take 10 mg by mouth at bedtime.    Marland Kitchen aspirin 81 MG tablet Take 81 mg by mouth daily. (Patient not taking: Reported on 11/11/2020)    . aspirin EC 325 MG tablet Take 650 mg by mouth daily as needed for mild pain or moderate pain. (Patient not taking: Reported on 11/11/2020)    . brexpiprazole (REXULTI) 1 MG TABS tablet Take by mouth daily. (Patient not taking: Reported on 11/11/2020)    . calcium carbonate (TUMS - DOSED IN MG ELEMENTAL CALCIUM) 500 MG chewable tablet Chew 1 tablet by mouth daily as needed for indigestion or heartburn. (Patient not taking: Reported on 11/11/2020)    . clonazePAM (KLONOPIN) 0.5 MG tablet Take 0.5 mg by mouth 2 (two) times daily.    Marland Kitchen escitalopram (LEXAPRO) 20 MG tablet Take 20 mg by mouth daily.    . ferrous sulfate 325 (65 FE) MG tablet Take 325 mg by mouth daily.    Marland Kitchen oxybutynin (DITROPAN) 5 MG tablet Take 5 mg by mouth as directed. EVERY 5 TO 6 HOURS    . pantoprazole (PROTONIX) 20 MG tablet Take 20 mg by mouth daily.     No current facility-administered medications for this visit.    PAST MEDICAL HISTORY: Past Medical History:  Diagnosis Date  . Anemia   . Anxiety and depression   . Arthritis   . Atherosclerosis of aorta ([place])   . COPD (chronic obstructive pulmonary disease) ([place])   . Dyspnea   . Emphysema lung ([place])   . HOH (hard of hearing)   . Idiopathic peripheral neuropathy   . MRSA infection    H/O  . Neuropathy   . Osteoarthritis   . Paresis of  lower extremity ([place])   . Tremors of nervous system   . Wheezing     PAST SURGICAL HISTORY: Past Surgical History:  Procedure Laterality Date  . APPENDECTOMY  1966  . CATARACT EXTRACTION W/PHACO Left 05/10/2017   Procedure: CATARACT EXTRACTION PHACO AND INTRAOCULAR LENS PLACEMENT ([facility]);  Surgeon: Eulogio Bear, MD;  Location: [facility] ORS;  Service: Ophthalmology;  Laterality: Left;  [place] 00:24.4 AP% 7.0 CDE 1.72 FLUID PACK  LOT # [id number]  . CATARACT EXTRACTION W/PHACO Right 06/07/2017   Procedure: CATARACT EXTRACTION PHACO AND INTRAOCULAR LENS PLACEMENT ([facility]);  Surgeon: Eulogio Bear, MD;  Location: [facility] ORS;  Service: Ophthalmology;  Laterality: Right;  [place] 00:22.6 AP% 9.2 CDE 2.07 FLUID PACK LOT # [id number] H  . COLONOSCOPY    . FOOT SURGERY Bilateral 2010  . HERNIA REPAIR  1995  . RCR Right 2010    FAMILY HISTORY: Family History  Problem Relation Age of Onset  . Hepatitis Mother   . Hyperlipidemia Mother   . Hypertension Mother   . Prostate cancer Father   . Stroke Father   . COPD Brother   . Emphysema Brother   . Hypertension Brother   . Heart attack Brother   . Emphysema Maternal Grandmother   . Stroke Maternal Grandmother   . Hyperlipidemia Maternal Grandmother   . Hypertension Maternal Grandmother   . Heart attack Maternal Grandmother   . Hyperlipidemia Paternal Grandmother   . Arthritis Paternal Grandmother     SOCIAL HISTORY: Social History   Socioeconomic History  . Marital status: Married    Spouse name: Randell Loop  . Number of children: 3  . Years of education: Not on file  . Highest education level: High school graduate  Occupational History  . Not on file  Tobacco Use  . Smoking status: Current Every Day Smoker    Packs/day: 0.75    Years: 45.00    Pack years: 33.75  . Smokeless tobacco: Never Used  Substance and Sexual Activity  . Alcohol use: No  . Drug use: Never  . Sexual activity: Not on file  Other Topics Concern  . Not on file  Social History Narrative   Lives with wife   Caffeine- coffee 2 c daily   Social Determinants of Health   Financial Resource Strain: Not on file  Food Insecurity: Not on file  Transportation Needs: Not on file  Physical Activity: Not on file  Stress: Not on file  Social Connections: Not on file  Intimate Partner Violence: Not on file     PHYSICAL EXAM   There were no vitals filed for this visit. Not recorded      There is no height or weight on file to calculate BMI.  PHYSICAL EXAMNIATION:  Gen: NAD, conversant, well nourised, well groomed             NEUROLOGICAL EXAM:  MENTAL STATUS: Speech/cognition: Awake, alert, oriented to history taking and casual conversation,   CRANIAL NERVES: CN II: Visual fields are full to confrontation. Pupils are round equal and briskly reactive to light. CN III, IV, VI: extraocular movement are normal. No ptosis. CN V: Facial sensation is intact to light touch CN VII: Face is symmetric with normal eye closure  CN VIII: Hearing is normal to causal conversation. CN IX, X: Phonation is normal. CN XI: Head turning and shoulder shrug are intact  MOTOR: Mild bilateral hand intrinsic muscle atrophy, mild bilateral finger abduction weakness, no proximal upper extremity weakness, mild bilateral hip flexion  weakness, moderate bilateral ankle dorsiflexion weakness  REFLEXES: Reflexes are 2+ and symmetric at the biceps, triceps, knees, and absent at ankles. Plantar responses are flexor.  SENSORY: Length dependent decreased light touch, vibratory sensation, pinprick to upper thigh level, decreased pinprick to elbow level bilaterally  COORDINATION: There is no trunk or limb dysmetria noted.  GAIT/STANCE: Deferred  DIAGNOSTIC DATA (LABS, IMAGING, TESTING) - I reviewed patient records, labs, notes, testing and imaging myself where available.   ASSESSMENT AND PLAN  Angell Honse is a 65 y.o. 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 Ph.D.  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

## 2020-11-22 NOTE — Telephone Encounter (Signed)
Please make sure he is on schedule for MRI of the cervical and lumbar spine as previously ordered

## 2020-11-23 DIAGNOSIS — G629 Polyneuropathy, unspecified: Secondary | ICD-10-CM | POA: Insufficient documentation

## 2020-11-23 DIAGNOSIS — R350 Frequency of micturition: Secondary | ICD-10-CM | POA: Diagnosis not present

## 2020-11-23 DIAGNOSIS — M1991 Primary osteoarthritis, unspecified site: Secondary | ICD-10-CM | POA: Diagnosis not present

## 2020-11-23 DIAGNOSIS — G8220 Paraplegia, unspecified: Secondary | ICD-10-CM | POA: Diagnosis not present

## 2020-11-23 DIAGNOSIS — I700 Atherosclerosis of aorta: Secondary | ICD-10-CM | POA: Diagnosis not present

## 2020-11-23 DIAGNOSIS — L89310 Pressure ulcer of right buttock, unstageable: Secondary | ICD-10-CM | POA: Diagnosis not present

## 2020-11-23 DIAGNOSIS — D509 Iron deficiency anemia, unspecified: Secondary | ICD-10-CM | POA: Diagnosis not present

## 2020-11-23 DIAGNOSIS — Z9181 History of falling: Secondary | ICD-10-CM | POA: Diagnosis not present

## 2020-11-23 DIAGNOSIS — J982 Interstitial emphysema: Secondary | ICD-10-CM | POA: Diagnosis not present

## 2020-11-23 DIAGNOSIS — F17210 Nicotine dependence, cigarettes, uncomplicated: Secondary | ICD-10-CM | POA: Diagnosis not present

## 2020-11-23 NOTE — Procedures (Signed)
Full Name: Jimmy Le Gender: Male MRN #: [id number] Date of Birth: 1955/12/11    Visit Date: 11/22/2020 08:10 Age: 65 Years Examining Physician: Marcial Pacas, MD  Referring Physician: Marcial Pacas, MD History: 66 year old male with few years history of progressive ascending bilateral lower extremity paresthesia, weakness, gait abnormalities Summary of the test: Nerve conduction study: Right sural, superficial peroneal, ulnar sensory responses were absent.  Right radial sensory response showed moderately prolonged peak latency with significantly decreased snap amplitude.  Right median sensory response showed normal peak latency with moderately decreased snap amplitude  Right peroneal, tibial motor responses were absent.  Right ulnar motor responses were normal.  Right median motor responses showed borderline prolonged distal latency with normal CMAP amplitude,  low normal range conduction velocity. Right ulnar F-wave latency was significantly prolonged in the setting of normal right ulnar motor response.  Electromyography: Selected needle examination of left lower extremity muscles, left lumbosacral paraspinal muscles, left upper extremity muscles, left cervical paraspinal muscles were performed  There is evidence of active denervation at the left distal leg muscles, with chronic neuropathic changes  There is also evidence of enlarged motor unit potential with decreased recruitment at the left proximal lower extremity muscles, left distal upper extremity muscles.  There was no spontaneous activity at the left lumbosacral paraspinal muscles, but there is evidence of complex motor unit potential noted  There was no spontaneous activity at left cervical paraspinal muscles.  Conclusion: This is an abnormal study.  There is evidence of severe axonal sensorimotor polyneuropathy, with evidence of demyelinating features.    ------------------------------- Marcial Pacas, M.D. PhD  [facility] [address] [place], [place] [address] Tel: [phone] Fax: [phone]  Verbal informed consent was obtained from the patient, patient was informed of potential risk of procedure, including bruising, bleeding, hematoma formation, infection, muscle weakness, muscle pain, numbness, among others.        [place]    Nerve / Sites Muscle Latency Ref. Amplitude Ref. Rel Amp Segments Distance Velocity Ref. Area    ms ms mV mV %  cm m/s m/s mVms  R Median - APB     Wrist APB 4.4 ?4.4 6.2 ?4.0 100 Wrist - APB 7   17.6     Upper arm APB 9.1  4.5  71.7 Upper arm - Wrist 23 49 ?49 14.5  R Ulnar - ADM     Wrist ADM 3.2 ?3.3 7.4 ?6.0 100 Wrist - ADM 7   22.0     B.Elbow ADM 7.0  7.1  96.8 B.Elbow - Wrist 19 51 ?49 22.5     A.Elbow ADM 8.9  7.1  99.4 A.Elbow - B.Elbow 10 52 ?49 24.9  R Peroneal - EDB     Ankle EDB NR ?6.5 NR ?2.0 NR Ankle - EDB 9   NR     Fib head EDB NR  NR  NR Fib head - Ankle 30 NR ?44 NR     Pop fossa EDB NR  NR  NR Pop fossa - Fib head 10 NR ?44 NR         Pop fossa - Ankle      R Tibial - AH     Ankle AH NR ?5.8 NR ?4.0 NR Ankle - AH 9 NR  NR             SNC    Nerve / Sites Rec. Site Peak Lat Ref.  Amp Ref. Segments  Distance    ms ms V V  cm  R Radial - Anatomical snuff box (Forearm)     Forearm Wrist 2.7 ?2.9 9 ?15 Forearm - Wrist 10  R Sural - Ankle (Calf)     Calf Ankle NR ?4.4 NR ?6 Calf - Ankle 14  R Superficial peroneal - Ankle     Lat leg Ankle NR ?4.4 NR ?6 Lat leg - Ankle 14  R Median - Orthodromic (Dig II, Mid palm)     Dig II Wrist 4.1 ?3.4 3 ?10 Dig II - Wrist 13  R Ulnar - Orthodromic, (Dig V, Mid palm)     Dig V Wrist NR ?3.1 NR ?5 Dig V - Wrist 42               F  Wave    Nerve F Lat Ref.   ms ms  R Ulnar - ADM 37.8 ?32.0       EMG Summary Table    Spontaneous MUAP Recruitment  Muscle IA Fib PSW Fasc Other Amp Dur. Poly Pattern  L. Biceps brachii Increased None None None _______ Normal Normal Normal Reduced   L. Deltoid Normal None None None _______ Normal Normal Normal Normal  L. Triceps brachii Normal None None None _______ Normal Normal Normal Normal  L. Pronator teres Increased None None None _______ Normal Normal Normal Reduced  L. Brachioradialis Increased None None None _______ Normal Normal Normal Reduced  L. Extensor digitorum communis Increased 1+ None None _______ Increased Increased 1+ Reduced  L. Flexor digitorum profundus (Ulnar) Increased None None None _______ Increased Increased 1+ Reduced  L. First dorsal interosseous Increased 1+ None None _______ Increased Increased 1+ Reduced  L. Cervical paraspinals Normal None None None _______ Normal Normal Normal Normal  L. Tibialis anterior Increased 1+ None None _______ Increased Increased 1+ Reduced  L. Tibialis posterior Increased 1+ None None _______ Increased Increased 1+ Reduced  L. Peroneus longus Increased 1+ None None _______ Increased Increased 1+ Reduced  L. Gastrocnemius (Medial head) Increased 1+ 1+ None _______ Increased Increased 1+ Reduced  L. Vastus lateralis Increased None None None _______ Increased Increased 1+ Reduced  L. Rectus femoris Increased None None None _______ Increased Increased 1+ Reduced  L. Lumbar paraspinals (low) Normal None None None _______ Decreased Increased 1+ Normal  L. Lumbar paraspinals (mid) Normal None None None _______ Decreased Increased 1+ Normal

## 2020-11-24 DIAGNOSIS — I700 Atherosclerosis of aorta: Secondary | ICD-10-CM | POA: Diagnosis not present

## 2020-11-24 DIAGNOSIS — G8220 Paraplegia, unspecified: Secondary | ICD-10-CM | POA: Diagnosis not present

## 2020-11-24 DIAGNOSIS — L89310 Pressure ulcer of right buttock, unstageable: Secondary | ICD-10-CM | POA: Diagnosis not present

## 2020-11-24 DIAGNOSIS — D509 Iron deficiency anemia, unspecified: Secondary | ICD-10-CM | POA: Diagnosis not present

## 2020-11-24 DIAGNOSIS — F17210 Nicotine dependence, cigarettes, uncomplicated: Secondary | ICD-10-CM | POA: Diagnosis not present

## 2020-11-24 DIAGNOSIS — M1991 Primary osteoarthritis, unspecified site: Secondary | ICD-10-CM | POA: Diagnosis not present

## 2020-11-24 DIAGNOSIS — J982 Interstitial emphysema: Secondary | ICD-10-CM | POA: Diagnosis not present

## 2020-11-24 DIAGNOSIS — Z9181 History of falling: Secondary | ICD-10-CM | POA: Diagnosis not present

## 2020-11-24 DIAGNOSIS — R350 Frequency of micturition: Secondary | ICD-10-CM | POA: Diagnosis not present

## 2020-11-24 LAB — VITAMIN B12: Vitamin B-12: 449 pg/mL (ref 232–1245)

## 2020-11-25 ENCOUNTER — Telehealth: Payer: Self-pay | Admitting: Neurology

## 2020-11-25 DIAGNOSIS — M1991 Primary osteoarthritis, unspecified site: Secondary | ICD-10-CM | POA: Diagnosis not present

## 2020-11-25 DIAGNOSIS — G8220 Paraplegia, unspecified: Secondary | ICD-10-CM | POA: Diagnosis not present

## 2020-11-25 DIAGNOSIS — I700 Atherosclerosis of aorta: Secondary | ICD-10-CM | POA: Diagnosis not present

## 2020-11-25 DIAGNOSIS — D509 Iron deficiency anemia, unspecified: Secondary | ICD-10-CM | POA: Diagnosis not present

## 2020-11-25 DIAGNOSIS — R350 Frequency of micturition: Secondary | ICD-10-CM | POA: Diagnosis not present

## 2020-11-25 DIAGNOSIS — F17210 Nicotine dependence, cigarettes, uncomplicated: Secondary | ICD-10-CM | POA: Diagnosis not present

## 2020-11-25 DIAGNOSIS — L89310 Pressure ulcer of right buttock, unstageable: Secondary | ICD-10-CM | POA: Diagnosis not present

## 2020-11-25 DIAGNOSIS — Z9181 History of falling: Secondary | ICD-10-CM | POA: Diagnosis not present

## 2020-11-25 DIAGNOSIS — J982 Interstitial emphysema: Secondary | ICD-10-CM | POA: Diagnosis not present

## 2020-11-25 LAB — CBC WITH DIFFERENTIAL
Basophils Absolute: 0 10*3/uL (ref 0.0–0.2)
Basos: 1 %
EOS (ABSOLUTE): 0.4 10*3/uL (ref 0.0–0.4)
Eos: 5 %
Hematocrit: 40.2 % (ref 37.5–51.0)
Hemoglobin: 12.7 g/dL — ABNORMAL LOW (ref 13.0–17.7)
Immature Grans (Abs): 0 10*3/uL (ref 0.0–0.1)
Immature Granulocytes: 0 %
Lymphocytes Absolute: 1.4 10*3/uL (ref 0.7–3.1)
Lymphs: 16 %
MCH: 28.4 pg (ref 26.6–33.0)
MCHC: 31.6 g/dL (ref 31.5–35.7)
MCV: 90 fL (ref 79–97)
Monocytes Absolute: 0.5 10*3/uL (ref 0.1–0.9)
Monocytes: 5 %
Neutrophils Absolute: 6.5 10*3/uL (ref 1.4–7.0)
Neutrophils: 73 %
RBC: 4.47 x10E6/uL (ref 4.14–5.80)
RDW: 16.9 % — ABNORMAL HIGH (ref 11.6–15.4)
WBC: 8.9 10*3/uL (ref 3.4–10.8)

## 2020-11-25 LAB — VITAMIN D 25 HYDROXY (VIT D DEFICIENCY, FRACTURES): Vit D, 25-Hydroxy: 25.5 ng/mL — ABNORMAL LOW (ref 30.0–100.0)

## 2020-11-25 LAB — MULTIPLE MYELOMA PANEL, SERUM
Albumin SerPl Elph-Mcnc: 3.7 g/dL (ref 2.9–4.4)
Albumin/Glob SerPl: 0.8 (ref 0.7–1.7)
Alpha 1: 0.4 g/dL (ref 0.0–0.4)
Alpha2 Glob SerPl Elph-Mcnc: 1.1 g/dL — ABNORMAL HIGH (ref 0.4–1.0)
B-Globulin SerPl Elph-Mcnc: 1.3 g/dL (ref 0.7–1.3)
Gamma Glob SerPl Elph-Mcnc: 2.1 g/dL — ABNORMAL HIGH (ref 0.4–1.8)
Globulin, Total: 4.9 g/dL — ABNORMAL HIGH (ref 2.2–3.9)
IgA/Immunoglobulin A, Serum: 342 mg/dL (ref 61–437)
IgG (Immunoglobin G), Serum: 2073 mg/dL — ABNORMAL HIGH (ref 603–1613)
IgM (Immunoglobulin M), Srm: 131 mg/dL (ref 20–172)

## 2020-11-25 LAB — HEPATITIS PANEL, ACUTE
Hep A IgM: NEGATIVE
Hep B C IgM: NEGATIVE
Hep C Virus Ab: <0.1 s/co ratio (ref 0.0–0.9)
Hepatitis B Surface Ag: NEGATIVE

## 2020-11-25 LAB — COMPREHENSIVE METABOLIC PANEL
ALT: 11 IU/L (ref 0–44)
AST: 13 IU/L (ref 0–40)
Albumin/Globulin Ratio: 1.2 (ref 1.2–2.2)
Albumin: 4.6 g/dL (ref 3.8–4.8)
Alkaline Phosphatase: 96 IU/L (ref 44–121)
BUN/Creatinine Ratio: 11 (ref 10–24)
BUN: 11 mg/dL (ref 8–27)
Bilirubin Total: 0.5 mg/dL (ref 0.0–1.2)
CO2: 26 mmol/L (ref 20–29)
Calcium: 9.7 mg/dL (ref 8.6–10.2)
Chloride: 99 mmol/L (ref 96–106)
Creatinine, Ser: 0.96 mg/dL (ref 0.76–1.27)
Globulin, Total: 4 g/dL (ref 1.5–4.5)
Glucose: 99 mg/dL (ref 65–99)
Potassium: 4.5 mmol/L (ref 3.5–5.2)
Sodium: 140 mmol/L (ref 134–144)
Total Protein: 8.6 g/dL — ABNORMAL HIGH (ref 6.0–8.5)
eGFR: 88 mL/min/{1.73_m2} (ref >59)

## 2020-11-25 LAB — SEDIMENTATION RATE: Sed Rate: 43 mm/hr — ABNORMAL HIGH (ref 0–30)

## 2020-11-25 LAB — ANA W/REFLEX IF POSITIVE: Anti Nuclear Antibody (ANA): NEGATIVE

## 2020-11-25 LAB — TSH: TSH: 3.52 u[IU]/mL (ref 0.450–4.500)

## 2020-11-25 LAB — C-REACTIVE PROTEIN: CRP: 45 mg/L — ABNORMAL HIGH (ref 0–10)

## 2020-11-25 LAB — HIV ANTIBODY (ROUTINE TESTING W REFLEX): HIV Screen 4th Generation wRfx: NONREACTIVE

## 2020-11-25 LAB — FOLATE: Folate: 5.9 ng/mL (ref >3.0)

## 2020-11-25 LAB — IRON AND TIBC
Iron Saturation: 7 % — CL (ref 15–55)
Iron: 28 ug/dL — ABNORMAL LOW (ref 38–169)
Total Iron Binding Capacity: 398 ug/dL (ref 250–450)
UIBC: 370 ug/dL — ABNORMAL HIGH (ref 111–343)

## 2020-11-25 LAB — HGB A1C W/O EAG: Hgb A1c MFr Bld: 5.4 % (ref 4.8–5.6)

## 2020-11-25 LAB — CK: Total CK: 58 U/L (ref 41–331)

## 2020-11-25 LAB — COPPER, SERUM: Copper: 173 ug/dL — ABNORMAL HIGH (ref 69–132)

## 2020-11-25 LAB — FERRITIN: Ferritin: 26 ng/mL — ABNORMAL LOW (ref 30–400)

## 2020-11-25 LAB — RPR: RPR Ser Ql: NONREACTIVE

## 2020-11-25 NOTE — Telephone Encounter (Signed)
I spoke to patient's wife of DPR.  She verbalized understanding of the findings.  He will keep his follow-up in May as planned.  He has been taking over-the-counter iron but had decreased it to every other day.  She will make sure he restarts taking it daily.  Says his iron is being watched closely by his PCP.  She will also start him on the recommended vitamin D supplement.

## 2020-11-25 NOTE — Telephone Encounter (Signed)
Please call patient, laboratory evaluation showed  Elevated C-reactive protein 45, ESR 43, this could indicate systemic inflammatory process, will consider repeat laboratory evaluation at next visit  There is also evidence of decreased iron level, ferritin of 26, he would get benefit from over-the-counter iron supplement ferrous sulfate, take as instructed.  Vitamin D level was mildly decreased 25.5, over-the-counter D3 1000 units daily  Rest of the laboratory evaluation showed no significant abnormalities.

## 2020-11-26 DIAGNOSIS — F17210 Nicotine dependence, cigarettes, uncomplicated: Secondary | ICD-10-CM | POA: Diagnosis not present

## 2020-11-26 DIAGNOSIS — J982 Interstitial emphysema: Secondary | ICD-10-CM | POA: Diagnosis not present

## 2020-11-26 DIAGNOSIS — D509 Iron deficiency anemia, unspecified: Secondary | ICD-10-CM | POA: Diagnosis not present

## 2020-11-26 DIAGNOSIS — M1991 Primary osteoarthritis, unspecified site: Secondary | ICD-10-CM | POA: Diagnosis not present

## 2020-11-26 DIAGNOSIS — L89310 Pressure ulcer of right buttock, unstageable: Secondary | ICD-10-CM | POA: Diagnosis not present

## 2020-11-26 DIAGNOSIS — Z9181 History of falling: Secondary | ICD-10-CM | POA: Diagnosis not present

## 2020-11-26 DIAGNOSIS — G8220 Paraplegia, unspecified: Secondary | ICD-10-CM | POA: Diagnosis not present

## 2020-11-26 DIAGNOSIS — R350 Frequency of micturition: Secondary | ICD-10-CM | POA: Diagnosis not present

## 2020-11-26 DIAGNOSIS — I700 Atherosclerosis of aorta: Secondary | ICD-10-CM | POA: Diagnosis not present

## 2020-11-29 DIAGNOSIS — L89310 Pressure ulcer of right buttock, unstageable: Secondary | ICD-10-CM | POA: Diagnosis not present

## 2020-11-29 DIAGNOSIS — J982 Interstitial emphysema: Secondary | ICD-10-CM | POA: Diagnosis not present

## 2020-11-29 DIAGNOSIS — G8220 Paraplegia, unspecified: Secondary | ICD-10-CM | POA: Diagnosis not present

## 2020-11-29 DIAGNOSIS — F17210 Nicotine dependence, cigarettes, uncomplicated: Secondary | ICD-10-CM | POA: Diagnosis not present

## 2020-11-29 DIAGNOSIS — R350 Frequency of micturition: Secondary | ICD-10-CM | POA: Diagnosis not present

## 2020-11-29 DIAGNOSIS — Z9181 History of falling: Secondary | ICD-10-CM | POA: Diagnosis not present

## 2020-11-29 DIAGNOSIS — I700 Atherosclerosis of aorta: Secondary | ICD-10-CM | POA: Diagnosis not present

## 2020-11-29 DIAGNOSIS — M1991 Primary osteoarthritis, unspecified site: Secondary | ICD-10-CM | POA: Diagnosis not present

## 2020-11-29 DIAGNOSIS — D509 Iron deficiency anemia, unspecified: Secondary | ICD-10-CM | POA: Diagnosis not present

## 2020-12-01 DIAGNOSIS — F17210 Nicotine dependence, cigarettes, uncomplicated: Secondary | ICD-10-CM | POA: Diagnosis not present

## 2020-12-01 DIAGNOSIS — J982 Interstitial emphysema: Secondary | ICD-10-CM | POA: Diagnosis not present

## 2020-12-01 DIAGNOSIS — R350 Frequency of micturition: Secondary | ICD-10-CM | POA: Diagnosis not present

## 2020-12-01 DIAGNOSIS — L89310 Pressure ulcer of right buttock, unstageable: Secondary | ICD-10-CM | POA: Diagnosis not present

## 2020-12-01 DIAGNOSIS — M1991 Primary osteoarthritis, unspecified site: Secondary | ICD-10-CM | POA: Diagnosis not present

## 2020-12-01 DIAGNOSIS — I700 Atherosclerosis of aorta: Secondary | ICD-10-CM | POA: Diagnosis not present

## 2020-12-01 DIAGNOSIS — D509 Iron deficiency anemia, unspecified: Secondary | ICD-10-CM | POA: Diagnosis not present

## 2020-12-01 DIAGNOSIS — G8220 Paraplegia, unspecified: Secondary | ICD-10-CM | POA: Diagnosis not present

## 2020-12-01 DIAGNOSIS — Z9181 History of falling: Secondary | ICD-10-CM | POA: Diagnosis not present

## 2020-12-02 DIAGNOSIS — G8220 Paraplegia, unspecified: Secondary | ICD-10-CM | POA: Diagnosis not present

## 2020-12-02 DIAGNOSIS — J982 Interstitial emphysema: Secondary | ICD-10-CM | POA: Diagnosis not present

## 2020-12-02 DIAGNOSIS — L89310 Pressure ulcer of right buttock, unstageable: Secondary | ICD-10-CM | POA: Diagnosis not present

## 2020-12-02 DIAGNOSIS — Z9181 History of falling: Secondary | ICD-10-CM | POA: Diagnosis not present

## 2020-12-02 DIAGNOSIS — M1991 Primary osteoarthritis, unspecified site: Secondary | ICD-10-CM | POA: Diagnosis not present

## 2020-12-02 DIAGNOSIS — I700 Atherosclerosis of aorta: Secondary | ICD-10-CM | POA: Diagnosis not present

## 2020-12-02 DIAGNOSIS — D509 Iron deficiency anemia, unspecified: Secondary | ICD-10-CM | POA: Diagnosis not present

## 2020-12-02 DIAGNOSIS — F17210 Nicotine dependence, cigarettes, uncomplicated: Secondary | ICD-10-CM | POA: Diagnosis not present

## 2020-12-02 DIAGNOSIS — R350 Frequency of micturition: Secondary | ICD-10-CM | POA: Diagnosis not present

## 2020-12-06 DIAGNOSIS — F17210 Nicotine dependence, cigarettes, uncomplicated: Secondary | ICD-10-CM | POA: Diagnosis not present

## 2020-12-06 DIAGNOSIS — Z9181 History of falling: Secondary | ICD-10-CM | POA: Diagnosis not present

## 2020-12-06 DIAGNOSIS — D509 Iron deficiency anemia, unspecified: Secondary | ICD-10-CM | POA: Diagnosis not present

## 2020-12-06 DIAGNOSIS — L89150 Pressure ulcer of sacral region, unstageable: Secondary | ICD-10-CM | POA: Diagnosis not present

## 2020-12-06 DIAGNOSIS — L89310 Pressure ulcer of right buttock, unstageable: Secondary | ICD-10-CM | POA: Diagnosis not present

## 2020-12-06 DIAGNOSIS — G8220 Paraplegia, unspecified: Secondary | ICD-10-CM | POA: Diagnosis not present

## 2020-12-06 DIAGNOSIS — G8310 Monoplegia of lower limb affecting unspecified side: Secondary | ICD-10-CM | POA: Diagnosis not present

## 2020-12-06 DIAGNOSIS — R350 Frequency of micturition: Secondary | ICD-10-CM | POA: Diagnosis not present

## 2020-12-06 DIAGNOSIS — I700 Atherosclerosis of aorta: Secondary | ICD-10-CM | POA: Diagnosis not present

## 2020-12-06 DIAGNOSIS — M1991 Primary osteoarthritis, unspecified site: Secondary | ICD-10-CM | POA: Diagnosis not present

## 2020-12-06 DIAGNOSIS — J982 Interstitial emphysema: Secondary | ICD-10-CM | POA: Diagnosis not present

## 2020-12-07 ENCOUNTER — Ambulatory Visit
Admission: RE | Admit: 2020-12-07 | Discharge: 2020-12-07 | Disposition: A | Discharge status: Home / Self Care | Payer: Medicare Other | Source: Ambulatory Visit | Attending: Neurology | Admitting: Neurology

## 2020-12-07 ENCOUNTER — Other Ambulatory Visit: Payer: Self-pay

## 2020-12-07 DIAGNOSIS — R531 Weakness: Secondary | ICD-10-CM

## 2020-12-07 DIAGNOSIS — R202 Paresthesia of skin: Secondary | ICD-10-CM | POA: Diagnosis not present

## 2020-12-07 DIAGNOSIS — R269 Unspecified abnormalities of gait and mobility: Secondary | ICD-10-CM

## 2020-12-08 DIAGNOSIS — G8220 Paraplegia, unspecified: Secondary | ICD-10-CM | POA: Diagnosis not present

## 2020-12-08 DIAGNOSIS — M1991 Primary osteoarthritis, unspecified site: Secondary | ICD-10-CM | POA: Diagnosis not present

## 2020-12-08 DIAGNOSIS — J982 Interstitial emphysema: Secondary | ICD-10-CM | POA: Diagnosis not present

## 2020-12-08 DIAGNOSIS — D509 Iron deficiency anemia, unspecified: Secondary | ICD-10-CM | POA: Diagnosis not present

## 2020-12-08 DIAGNOSIS — F17210 Nicotine dependence, cigarettes, uncomplicated: Secondary | ICD-10-CM | POA: Diagnosis not present

## 2020-12-08 DIAGNOSIS — R350 Frequency of micturition: Secondary | ICD-10-CM | POA: Diagnosis not present

## 2020-12-08 DIAGNOSIS — L89310 Pressure ulcer of right buttock, unstageable: Secondary | ICD-10-CM | POA: Diagnosis not present

## 2020-12-08 DIAGNOSIS — I700 Atherosclerosis of aorta: Secondary | ICD-10-CM | POA: Diagnosis not present

## 2020-12-08 DIAGNOSIS — Z9181 History of falling: Secondary | ICD-10-CM | POA: Diagnosis not present

## 2020-12-09 ENCOUNTER — Telehealth: Payer: Self-pay | Admitting: Neurology

## 2020-12-09 DIAGNOSIS — M4714 Other spondylosis with myelopathy, thoracic region: Secondary | ICD-10-CM | POA: Insufficient documentation

## 2020-12-09 NOTE — Telephone Encounter (Signed)
   MRI cervical spine without contrast demonstrating: - At C6-7 uncovertebral joint hypertrophy with severe right foraminal stenosis broad-based osteophytic disc osteophyte complex slight deformation of ventral spinal cord and mild bilateral foraminal stenosis.  No cord signal abnormalities. - Multilevel degenerative changes as noted above with multilevel foraminal stenoses.   MRI lumbar spine (without) demonstrating: - At T11-12: Disc bulging, facet and ligamentum flavum hypertrophy resulting in moderate to severe spinal stenosis and cord compression; increased T2 and STIR signal noted in the lower spinal cord at level of T11-12. - At L5-S1: 7 mm anterior spondylolisthesis of L5 and S1; bilateral pars interarticularis defects with moderate bilateral foraminal stenosis.  Please call patient, MRI of cervical spine showed evidence of degenerative changes, right foraminal stenosis at C6-7, no cord compression  MRI of lumbar spine also showed multilevel degenerative changes, with evidence of moderate to severe spinal stenosis and cord compression at T11-12, increased lower spinal cord signal abnormality,  I will refer him to neurosurgeon for evaluation  Make sure you keep follow-up visit as previously scheduled in May

## 2020-12-09 NOTE — Telephone Encounter (Signed)
I spoke to the patient and his wife. They verbalized understanding of the results. He is in agreement to see neurosurgery and is aware to expect a call for scheduling. He will also keep his 01/25/21 follow up here.

## 2020-12-10 DIAGNOSIS — L89310 Pressure ulcer of right buttock, unstageable: Secondary | ICD-10-CM | POA: Diagnosis not present

## 2020-12-10 DIAGNOSIS — F17210 Nicotine dependence, cigarettes, uncomplicated: Secondary | ICD-10-CM | POA: Diagnosis not present

## 2020-12-10 DIAGNOSIS — J982 Interstitial emphysema: Secondary | ICD-10-CM | POA: Diagnosis not present

## 2020-12-10 DIAGNOSIS — Z9181 History of falling: Secondary | ICD-10-CM | POA: Diagnosis not present

## 2020-12-10 DIAGNOSIS — M1991 Primary osteoarthritis, unspecified site: Secondary | ICD-10-CM | POA: Diagnosis not present

## 2020-12-10 DIAGNOSIS — G8220 Paraplegia, unspecified: Secondary | ICD-10-CM | POA: Diagnosis not present

## 2020-12-10 DIAGNOSIS — D509 Iron deficiency anemia, unspecified: Secondary | ICD-10-CM | POA: Diagnosis not present

## 2020-12-10 DIAGNOSIS — I700 Atherosclerosis of aorta: Secondary | ICD-10-CM | POA: Diagnosis not present

## 2020-12-10 DIAGNOSIS — R350 Frequency of micturition: Secondary | ICD-10-CM | POA: Diagnosis not present

## 2020-12-13 DIAGNOSIS — J982 Interstitial emphysema: Secondary | ICD-10-CM | POA: Diagnosis not present

## 2020-12-13 DIAGNOSIS — G8220 Paraplegia, unspecified: Secondary | ICD-10-CM | POA: Diagnosis not present

## 2020-12-13 DIAGNOSIS — M1991 Primary osteoarthritis, unspecified site: Secondary | ICD-10-CM | POA: Diagnosis not present

## 2020-12-13 DIAGNOSIS — R350 Frequency of micturition: Secondary | ICD-10-CM | POA: Diagnosis not present

## 2020-12-13 DIAGNOSIS — D509 Iron deficiency anemia, unspecified: Secondary | ICD-10-CM | POA: Diagnosis not present

## 2020-12-13 DIAGNOSIS — Z9181 History of falling: Secondary | ICD-10-CM | POA: Diagnosis not present

## 2020-12-13 DIAGNOSIS — F17210 Nicotine dependence, cigarettes, uncomplicated: Secondary | ICD-10-CM | POA: Diagnosis not present

## 2020-12-13 DIAGNOSIS — I700 Atherosclerosis of aorta: Secondary | ICD-10-CM | POA: Diagnosis not present

## 2020-12-13 DIAGNOSIS — L89310 Pressure ulcer of right buttock, unstageable: Secondary | ICD-10-CM | POA: Diagnosis not present

## 2020-12-13 NOTE — Telephone Encounter (Signed)
I have sent referral to [facility] . To schedule  .

## 2020-12-14 DIAGNOSIS — D649 Anemia, unspecified: Secondary | ICD-10-CM | POA: Diagnosis not present

## 2020-12-15 DIAGNOSIS — L89310 Pressure ulcer of right buttock, unstageable: Secondary | ICD-10-CM | POA: Diagnosis not present

## 2020-12-15 DIAGNOSIS — G8220 Paraplegia, unspecified: Secondary | ICD-10-CM | POA: Diagnosis not present

## 2020-12-15 DIAGNOSIS — R350 Frequency of micturition: Secondary | ICD-10-CM | POA: Diagnosis not present

## 2020-12-15 DIAGNOSIS — F17210 Nicotine dependence, cigarettes, uncomplicated: Secondary | ICD-10-CM | POA: Diagnosis not present

## 2020-12-15 DIAGNOSIS — D509 Iron deficiency anemia, unspecified: Secondary | ICD-10-CM | POA: Diagnosis not present

## 2020-12-15 DIAGNOSIS — M1991 Primary osteoarthritis, unspecified site: Secondary | ICD-10-CM | POA: Diagnosis not present

## 2020-12-15 DIAGNOSIS — Z9181 History of falling: Secondary | ICD-10-CM | POA: Diagnosis not present

## 2020-12-15 DIAGNOSIS — J982 Interstitial emphysema: Secondary | ICD-10-CM | POA: Diagnosis not present

## 2020-12-15 DIAGNOSIS — I700 Atherosclerosis of aorta: Secondary | ICD-10-CM | POA: Diagnosis not present

## 2020-12-16 DIAGNOSIS — L89310 Pressure ulcer of right buttock, unstageable: Secondary | ICD-10-CM | POA: Diagnosis not present

## 2020-12-16 DIAGNOSIS — I700 Atherosclerosis of aorta: Secondary | ICD-10-CM | POA: Diagnosis not present

## 2020-12-16 DIAGNOSIS — M1991 Primary osteoarthritis, unspecified site: Secondary | ICD-10-CM | POA: Diagnosis not present

## 2020-12-16 DIAGNOSIS — F17210 Nicotine dependence, cigarettes, uncomplicated: Secondary | ICD-10-CM | POA: Diagnosis not present

## 2020-12-16 DIAGNOSIS — G8220 Paraplegia, unspecified: Secondary | ICD-10-CM | POA: Diagnosis not present

## 2020-12-16 DIAGNOSIS — R350 Frequency of micturition: Secondary | ICD-10-CM | POA: Diagnosis not present

## 2020-12-16 DIAGNOSIS — Z9181 History of falling: Secondary | ICD-10-CM | POA: Diagnosis not present

## 2020-12-16 DIAGNOSIS — J982 Interstitial emphysema: Secondary | ICD-10-CM | POA: Diagnosis not present

## 2020-12-16 DIAGNOSIS — D509 Iron deficiency anemia, unspecified: Secondary | ICD-10-CM | POA: Diagnosis not present

## 2020-12-20 DIAGNOSIS — J982 Interstitial emphysema: Secondary | ICD-10-CM | POA: Diagnosis not present

## 2020-12-20 DIAGNOSIS — D509 Iron deficiency anemia, unspecified: Secondary | ICD-10-CM | POA: Diagnosis not present

## 2020-12-20 DIAGNOSIS — R350 Frequency of micturition: Secondary | ICD-10-CM | POA: Diagnosis not present

## 2020-12-20 DIAGNOSIS — Z9181 History of falling: Secondary | ICD-10-CM | POA: Diagnosis not present

## 2020-12-20 DIAGNOSIS — L89310 Pressure ulcer of right buttock, unstageable: Secondary | ICD-10-CM | POA: Diagnosis not present

## 2020-12-20 DIAGNOSIS — G8220 Paraplegia, unspecified: Secondary | ICD-10-CM | POA: Diagnosis not present

## 2020-12-20 DIAGNOSIS — F17210 Nicotine dependence, cigarettes, uncomplicated: Secondary | ICD-10-CM | POA: Diagnosis not present

## 2020-12-20 DIAGNOSIS — M1991 Primary osteoarthritis, unspecified site: Secondary | ICD-10-CM | POA: Diagnosis not present

## 2020-12-20 DIAGNOSIS — I700 Atherosclerosis of aorta: Secondary | ICD-10-CM | POA: Diagnosis not present

## 2020-12-22 DIAGNOSIS — D509 Iron deficiency anemia, unspecified: Secondary | ICD-10-CM | POA: Diagnosis not present

## 2020-12-22 DIAGNOSIS — J982 Interstitial emphysema: Secondary | ICD-10-CM | POA: Diagnosis not present

## 2020-12-22 DIAGNOSIS — L89310 Pressure ulcer of right buttock, unstageable: Secondary | ICD-10-CM | POA: Diagnosis not present

## 2020-12-22 DIAGNOSIS — M1991 Primary osteoarthritis, unspecified site: Secondary | ICD-10-CM | POA: Diagnosis not present

## 2020-12-22 DIAGNOSIS — R350 Frequency of micturition: Secondary | ICD-10-CM | POA: Diagnosis not present

## 2020-12-22 DIAGNOSIS — I700 Atherosclerosis of aorta: Secondary | ICD-10-CM | POA: Diagnosis not present

## 2020-12-22 DIAGNOSIS — F17210 Nicotine dependence, cigarettes, uncomplicated: Secondary | ICD-10-CM | POA: Diagnosis not present

## 2020-12-22 DIAGNOSIS — Z9181 History of falling: Secondary | ICD-10-CM | POA: Diagnosis not present

## 2020-12-22 DIAGNOSIS — G8220 Paraplegia, unspecified: Secondary | ICD-10-CM | POA: Diagnosis not present

## 2020-12-27 ENCOUNTER — Other Ambulatory Visit: Payer: Self-pay | Admitting: Neurological Surgery

## 2020-12-27 DIAGNOSIS — M4804 Spinal stenosis, thoracic region: Secondary | ICD-10-CM

## 2020-12-28 DIAGNOSIS — G8220 Paraplegia, unspecified: Secondary | ICD-10-CM | POA: Diagnosis not present

## 2020-12-28 DIAGNOSIS — D509 Iron deficiency anemia, unspecified: Secondary | ICD-10-CM | POA: Diagnosis not present

## 2020-12-28 DIAGNOSIS — I700 Atherosclerosis of aorta: Secondary | ICD-10-CM | POA: Diagnosis not present

## 2020-12-28 DIAGNOSIS — M1991 Primary osteoarthritis, unspecified site: Secondary | ICD-10-CM | POA: Diagnosis not present

## 2020-12-28 DIAGNOSIS — J982 Interstitial emphysema: Secondary | ICD-10-CM | POA: Diagnosis not present

## 2020-12-28 DIAGNOSIS — R350 Frequency of micturition: Secondary | ICD-10-CM | POA: Diagnosis not present

## 2020-12-28 DIAGNOSIS — F17210 Nicotine dependence, cigarettes, uncomplicated: Secondary | ICD-10-CM | POA: Diagnosis not present

## 2020-12-28 DIAGNOSIS — Z9181 History of falling: Secondary | ICD-10-CM | POA: Diagnosis not present

## 2020-12-29 ENCOUNTER — Ambulatory Visit: Payer: Medicare Other

## 2020-12-29 DIAGNOSIS — J982 Interstitial emphysema: Secondary | ICD-10-CM | POA: Diagnosis not present

## 2020-12-29 DIAGNOSIS — G8220 Paraplegia, unspecified: Secondary | ICD-10-CM | POA: Diagnosis not present

## 2020-12-29 DIAGNOSIS — F17210 Nicotine dependence, cigarettes, uncomplicated: Secondary | ICD-10-CM | POA: Diagnosis not present

## 2020-12-29 DIAGNOSIS — I700 Atherosclerosis of aorta: Secondary | ICD-10-CM | POA: Diagnosis not present

## 2020-12-29 DIAGNOSIS — Z9181 History of falling: Secondary | ICD-10-CM | POA: Diagnosis not present

## 2020-12-29 DIAGNOSIS — R350 Frequency of micturition: Secondary | ICD-10-CM | POA: Diagnosis not present

## 2020-12-29 DIAGNOSIS — M1991 Primary osteoarthritis, unspecified site: Secondary | ICD-10-CM | POA: Diagnosis not present

## 2020-12-29 DIAGNOSIS — D509 Iron deficiency anemia, unspecified: Secondary | ICD-10-CM | POA: Diagnosis not present

## 2020-12-31 DIAGNOSIS — Z9181 History of falling: Secondary | ICD-10-CM | POA: Diagnosis not present

## 2020-12-31 DIAGNOSIS — D509 Iron deficiency anemia, unspecified: Secondary | ICD-10-CM | POA: Diagnosis not present

## 2020-12-31 DIAGNOSIS — F17210 Nicotine dependence, cigarettes, uncomplicated: Secondary | ICD-10-CM | POA: Diagnosis not present

## 2020-12-31 DIAGNOSIS — I700 Atherosclerosis of aorta: Secondary | ICD-10-CM | POA: Diagnosis not present

## 2020-12-31 DIAGNOSIS — G8220 Paraplegia, unspecified: Secondary | ICD-10-CM | POA: Diagnosis not present

## 2020-12-31 DIAGNOSIS — M1991 Primary osteoarthritis, unspecified site: Secondary | ICD-10-CM | POA: Diagnosis not present

## 2020-12-31 DIAGNOSIS — J982 Interstitial emphysema: Secondary | ICD-10-CM | POA: Diagnosis not present

## 2020-12-31 DIAGNOSIS — R350 Frequency of micturition: Secondary | ICD-10-CM | POA: Diagnosis not present

## 2021-01-04 DIAGNOSIS — I700 Atherosclerosis of aorta: Secondary | ICD-10-CM | POA: Diagnosis not present

## 2021-01-04 DIAGNOSIS — R350 Frequency of micturition: Secondary | ICD-10-CM | POA: Diagnosis not present

## 2021-01-04 DIAGNOSIS — G8220 Paraplegia, unspecified: Secondary | ICD-10-CM | POA: Diagnosis not present

## 2021-01-04 DIAGNOSIS — D509 Iron deficiency anemia, unspecified: Secondary | ICD-10-CM | POA: Diagnosis not present

## 2021-01-04 DIAGNOSIS — Z9181 History of falling: Secondary | ICD-10-CM | POA: Diagnosis not present

## 2021-01-04 DIAGNOSIS — J982 Interstitial emphysema: Secondary | ICD-10-CM | POA: Diagnosis not present

## 2021-01-04 DIAGNOSIS — M1991 Primary osteoarthritis, unspecified site: Secondary | ICD-10-CM | POA: Diagnosis not present

## 2021-01-04 DIAGNOSIS — F17210 Nicotine dependence, cigarettes, uncomplicated: Secondary | ICD-10-CM | POA: Diagnosis not present

## 2021-01-06 ENCOUNTER — Ambulatory Visit
Admission: RE | Admit: 2021-01-06 | Discharge: 2021-01-06 | Disposition: A | Discharge status: Home / Self Care | Payer: Medicare Other | Source: Ambulatory Visit | Attending: Neurological Surgery | Admitting: Neurological Surgery

## 2021-01-06 ENCOUNTER — Other Ambulatory Visit: Payer: Self-pay

## 2021-01-06 DIAGNOSIS — F17210 Nicotine dependence, cigarettes, uncomplicated: Secondary | ICD-10-CM | POA: Diagnosis not present

## 2021-01-06 DIAGNOSIS — M47814 Spondylosis without myelopathy or radiculopathy, thoracic region: Secondary | ICD-10-CM | POA: Diagnosis not present

## 2021-01-06 DIAGNOSIS — I700 Atherosclerosis of aorta: Secondary | ICD-10-CM | POA: Diagnosis not present

## 2021-01-06 DIAGNOSIS — R350 Frequency of micturition: Secondary | ICD-10-CM | POA: Diagnosis not present

## 2021-01-06 DIAGNOSIS — M1991 Primary osteoarthritis, unspecified site: Secondary | ICD-10-CM | POA: Diagnosis not present

## 2021-01-06 DIAGNOSIS — J982 Interstitial emphysema: Secondary | ICD-10-CM | POA: Diagnosis not present

## 2021-01-06 DIAGNOSIS — G8310 Monoplegia of lower limb affecting unspecified side: Secondary | ICD-10-CM | POA: Diagnosis not present

## 2021-01-06 DIAGNOSIS — L89150 Pressure ulcer of sacral region, unstageable: Secondary | ICD-10-CM | POA: Diagnosis not present

## 2021-01-06 DIAGNOSIS — M4804 Spinal stenosis, thoracic region: Secondary | ICD-10-CM | POA: Insufficient documentation

## 2021-01-06 DIAGNOSIS — D509 Iron deficiency anemia, unspecified: Secondary | ICD-10-CM | POA: Diagnosis not present

## 2021-01-06 DIAGNOSIS — G9520 Unspecified cord compression: Secondary | ICD-10-CM | POA: Diagnosis not present

## 2021-01-06 DIAGNOSIS — G8220 Paraplegia, unspecified: Secondary | ICD-10-CM | POA: Diagnosis not present

## 2021-01-06 DIAGNOSIS — M5124 Other intervertebral disc displacement, thoracic region: Secondary | ICD-10-CM | POA: Diagnosis not present

## 2021-01-06 DIAGNOSIS — Z9181 History of falling: Secondary | ICD-10-CM | POA: Diagnosis not present

## 2021-01-10 DIAGNOSIS — M4804 Spinal stenosis, thoracic region: Secondary | ICD-10-CM | POA: Diagnosis not present

## 2021-01-10 DIAGNOSIS — M4714 Other spondylosis with myelopathy, thoracic region: Secondary | ICD-10-CM | POA: Diagnosis not present

## 2021-01-11 ENCOUNTER — Other Ambulatory Visit: Payer: Self-pay | Admitting: Neurological Surgery

## 2021-01-12 DIAGNOSIS — R350 Frequency of micturition: Secondary | ICD-10-CM | POA: Diagnosis not present

## 2021-01-12 DIAGNOSIS — G8220 Paraplegia, unspecified: Secondary | ICD-10-CM | POA: Diagnosis not present

## 2021-01-12 DIAGNOSIS — M1991 Primary osteoarthritis, unspecified site: Secondary | ICD-10-CM | POA: Diagnosis not present

## 2021-01-12 DIAGNOSIS — D509 Iron deficiency anemia, unspecified: Secondary | ICD-10-CM | POA: Diagnosis not present

## 2021-01-12 DIAGNOSIS — Z9181 History of falling: Secondary | ICD-10-CM | POA: Diagnosis not present

## 2021-01-12 DIAGNOSIS — F17210 Nicotine dependence, cigarettes, uncomplicated: Secondary | ICD-10-CM | POA: Diagnosis not present

## 2021-01-12 DIAGNOSIS — I700 Atherosclerosis of aorta: Secondary | ICD-10-CM | POA: Diagnosis not present

## 2021-01-12 DIAGNOSIS — J982 Interstitial emphysema: Secondary | ICD-10-CM | POA: Diagnosis not present

## 2021-01-14 DIAGNOSIS — R350 Frequency of micturition: Secondary | ICD-10-CM | POA: Diagnosis not present

## 2021-01-14 DIAGNOSIS — I700 Atherosclerosis of aorta: Secondary | ICD-10-CM | POA: Diagnosis not present

## 2021-01-14 DIAGNOSIS — J982 Interstitial emphysema: Secondary | ICD-10-CM | POA: Diagnosis not present

## 2021-01-14 DIAGNOSIS — D509 Iron deficiency anemia, unspecified: Secondary | ICD-10-CM | POA: Diagnosis not present

## 2021-01-14 DIAGNOSIS — Z9181 History of falling: Secondary | ICD-10-CM | POA: Diagnosis not present

## 2021-01-14 DIAGNOSIS — M1991 Primary osteoarthritis, unspecified site: Secondary | ICD-10-CM | POA: Diagnosis not present

## 2021-01-14 DIAGNOSIS — G8220 Paraplegia, unspecified: Secondary | ICD-10-CM | POA: Diagnosis not present

## 2021-01-14 DIAGNOSIS — F17210 Nicotine dependence, cigarettes, uncomplicated: Secondary | ICD-10-CM | POA: Diagnosis not present

## 2021-01-17 ENCOUNTER — Other Ambulatory Visit: Payer: Self-pay

## 2021-01-17 ENCOUNTER — Encounter (HOSPITAL_COMMUNITY): Payer: Self-pay | Admitting: Neurological Surgery

## 2021-01-17 NOTE — Progress Notes (Signed)
PCP - Dr. Arline Asp  Cardiologist - Denies  Chest x-ray - Denies  EKG - Denies  Stress Test - Denies  ECHO - Denies  Cardiac Cath - Denies  AICD-na PM-na LOOP-na  Sleep Study - Denies CPAP - Denies  LABS- 01/18/21: CBC, COVID, PCR  ASA- Denies  ERAS- No  HA1C- Denies  Anesthesia- No  Pt denies having chest pain, sob, or fever during the pre-op phone call. All instructions explained to the pt, with a verbal understanding of the material including as of today, stop taking all Aspirin (unless instructed by your doctor) and Other Aspirin containing products, Vitamins, Fish oils, and Herbal medications. Also stop all NSAIDS i.e. Advil, Ibuprofen, Motrin, Aleve, Anaprox, Naproxen, BC, Goody Powders, and all Supplements.The opportunity to ask questions was provided.   Coronavirus Screening  Have you experienced the following symptoms:  Cough yes/no: No Fever (>100.65F)  yes/no: No Runny nose yes/no: No Sore throat yes/no: No Difficulty breathing/shortness of breath  yes/no: No  Have you or a family member traveled in the last 14 days and where? yes/no: No    If the patient indicates "YES" to the above questions, their PAT will be rescheduled to limit the exposure to others and, the surgeon will be notified. THE PATIENT WILL NEED TO BE ASYMPTOMATIC FOR 14 DAYS.   If the patient is not experiencing any of these symptoms, the PAT nurse will instruct them to NOT bring anyone with them to their appointment since they may have these symptoms or traveled as well.   Please remind your patients and families that hospital visitation restrictions are in effect and the importance of the restrictions.

## 2021-01-18 ENCOUNTER — Ambulatory Visit (HOSPITAL_COMMUNITY): Payer: Medicare Other

## 2021-01-18 ENCOUNTER — Ambulatory Visit (HOSPITAL_COMMUNITY): Payer: Medicare Other | Admitting: Certified Registered"

## 2021-01-18 ENCOUNTER — Encounter (HOSPITAL_COMMUNITY): Payer: Self-pay | Admitting: Neurological Surgery

## 2021-01-18 ENCOUNTER — Encounter (HOSPITAL_COMMUNITY)
Admission: RE | Disposition: A | Discharge status: Skilled Nursing Facility | Payer: Self-pay | Source: Home / Self Care | Attending: Neurological Surgery

## 2021-01-18 ENCOUNTER — Inpatient Hospital Stay (HOSPITAL_COMMUNITY)
Admission: RE | Admit: 2021-01-18 | Discharge: 2021-01-24 | DRG: 519 | Disposition: A | Discharge status: Skilled Nursing Facility | Payer: Medicare Other | Attending: Neurological Surgery | Admitting: Neurological Surgery

## 2021-01-18 ENCOUNTER — Other Ambulatory Visit: Payer: Self-pay

## 2021-01-18 DIAGNOSIS — R1310 Dysphagia, unspecified: Secondary | ICD-10-CM | POA: Diagnosis not present

## 2021-01-18 DIAGNOSIS — Z993 Dependence on wheelchair: Secondary | ICD-10-CM | POA: Diagnosis not present

## 2021-01-18 DIAGNOSIS — M4714 Other spondylosis with myelopathy, thoracic region: Secondary | ICD-10-CM | POA: Diagnosis not present

## 2021-01-18 DIAGNOSIS — Z823 Family history of stroke: Secondary | ICD-10-CM | POA: Diagnosis not present

## 2021-01-18 DIAGNOSIS — M4804 Spinal stenosis, thoracic region: Secondary | ICD-10-CM | POA: Diagnosis present

## 2021-01-18 DIAGNOSIS — Z8614 Personal history of Methicillin resistant Staphylococcus aureus infection: Secondary | ICD-10-CM

## 2021-01-18 DIAGNOSIS — I700 Atherosclerosis of aorta: Secondary | ICD-10-CM | POA: Diagnosis not present

## 2021-01-18 DIAGNOSIS — M1990 Unspecified osteoarthritis, unspecified site: Secondary | ICD-10-CM | POA: Diagnosis present

## 2021-01-18 DIAGNOSIS — Z825 Family history of asthma and other chronic lower respiratory diseases: Secondary | ICD-10-CM

## 2021-01-18 DIAGNOSIS — Z6841 Body Mass Index (BMI) 40.0 and over, adult: Secondary | ICD-10-CM | POA: Diagnosis not present

## 2021-01-18 DIAGNOSIS — G8220 Paraplegia, unspecified: Secondary | ICD-10-CM | POA: Diagnosis not present

## 2021-01-18 DIAGNOSIS — F329 Major depressive disorder, single episode, unspecified: Secondary | ICD-10-CM | POA: Diagnosis present

## 2021-01-18 DIAGNOSIS — Z8249 Family history of ischemic heart disease and other diseases of the circulatory system: Secondary | ICD-10-CM | POA: Diagnosis not present

## 2021-01-18 DIAGNOSIS — K219 Gastro-esophageal reflux disease without esophagitis: Secondary | ICD-10-CM | POA: Diagnosis present

## 2021-01-18 DIAGNOSIS — H9190 Unspecified hearing loss, unspecified ear: Secondary | ICD-10-CM | POA: Diagnosis not present

## 2021-01-18 DIAGNOSIS — R0902 Hypoxemia: Secondary | ICD-10-CM | POA: Diagnosis not present

## 2021-01-18 DIAGNOSIS — M6281 Muscle weakness (generalized): Secondary | ICD-10-CM | POA: Diagnosis not present

## 2021-01-18 DIAGNOSIS — R252 Cramp and spasm: Secondary | ICD-10-CM | POA: Diagnosis not present

## 2021-01-18 DIAGNOSIS — G992 Myelopathy in diseases classified elsewhere: Secondary | ICD-10-CM | POA: Diagnosis not present

## 2021-01-18 DIAGNOSIS — Z743 Need for continuous supervision: Secondary | ICD-10-CM | POA: Diagnosis not present

## 2021-01-18 DIAGNOSIS — Z20822 Contact with and (suspected) exposure to covid-19: Secondary | ICD-10-CM | POA: Diagnosis present

## 2021-01-18 DIAGNOSIS — R159 Full incontinence of feces: Secondary | ICD-10-CM | POA: Diagnosis present

## 2021-01-18 DIAGNOSIS — R293 Abnormal posture: Secondary | ICD-10-CM | POA: Diagnosis not present

## 2021-01-18 DIAGNOSIS — F419 Anxiety disorder, unspecified: Secondary | ICD-10-CM | POA: Diagnosis present

## 2021-01-18 DIAGNOSIS — G629 Polyneuropathy, unspecified: Secondary | ICD-10-CM | POA: Diagnosis not present

## 2021-01-18 DIAGNOSIS — J449 Chronic obstructive pulmonary disease, unspecified: Secondary | ICD-10-CM | POA: Diagnosis not present

## 2021-01-18 DIAGNOSIS — R278 Other lack of coordination: Secondary | ICD-10-CM | POA: Diagnosis not present

## 2021-01-18 DIAGNOSIS — Z83438 Family history of other disorder of lipoprotein metabolism and other lipidemia: Secondary | ICD-10-CM

## 2021-01-18 DIAGNOSIS — R262 Difficulty in walking, not elsewhere classified: Secondary | ICD-10-CM | POA: Diagnosis not present

## 2021-01-18 DIAGNOSIS — Z981 Arthrodesis status: Secondary | ICD-10-CM | POA: Diagnosis not present

## 2021-01-18 DIAGNOSIS — R279 Unspecified lack of coordination: Secondary | ICD-10-CM | POA: Diagnosis not present

## 2021-01-18 DIAGNOSIS — Z741 Need for assistance with personal care: Secondary | ICD-10-CM | POA: Diagnosis not present

## 2021-01-18 DIAGNOSIS — Z79899 Other long term (current) drug therapy: Secondary | ICD-10-CM

## 2021-01-18 DIAGNOSIS — K592 Neurogenic bowel, not elsewhere classified: Secondary | ICD-10-CM | POA: Diagnosis present

## 2021-01-18 DIAGNOSIS — R202 Paresthesia of skin: Secondary | ICD-10-CM | POA: Diagnosis not present

## 2021-01-18 DIAGNOSIS — F17210 Nicotine dependence, cigarettes, uncomplicated: Secondary | ICD-10-CM | POA: Diagnosis not present

## 2021-01-18 DIAGNOSIS — J439 Emphysema, unspecified: Secondary | ICD-10-CM | POA: Diagnosis present

## 2021-01-18 DIAGNOSIS — Z419 Encounter for procedure for purposes other than remedying health state, unspecified: Secondary | ICD-10-CM

## 2021-01-18 DIAGNOSIS — Z9181 History of falling: Secondary | ICD-10-CM | POA: Diagnosis not present

## 2021-01-18 DIAGNOSIS — Z8042 Family history of malignant neoplasm of prostate: Secondary | ICD-10-CM | POA: Diagnosis not present

## 2021-01-18 HISTORY — DX: Gastro-esophageal reflux disease without esophagitis: K21.9

## 2021-01-18 HISTORY — PX: LUMBAR LAMINECTOMY/DECOMPRESSION MICRODISCECTOMY: SHX5026

## 2021-01-18 LAB — BASIC METABOLIC PANEL
Anion gap: 8 (ref 5–15)
BUN: 16 mg/dL (ref 8–23)
CO2: 25 mmol/L (ref 22–32)
Calcium: 8.9 mg/dL (ref 8.9–10.3)
Chloride: 103 mmol/L (ref 98–111)
Creatinine, Ser: 0.85 mg/dL (ref 0.61–1.24)
GFR, Estimated: >60 mL/min (ref >60)
Glucose, Bld: 99 mg/dL (ref 70–99)
Potassium: 4.1 mmol/L (ref 3.5–5.1)
Sodium: 136 mmol/L (ref 135–145)

## 2021-01-18 LAB — CBC
HCT: 39.8 % (ref 39.0–52.0)
Hemoglobin: 12.4 g/dL — ABNORMAL LOW (ref 13.0–17.0)
MCH: 29.8 pg (ref 26.0–34.0)
MCHC: 31.2 g/dL (ref 30.0–36.0)
MCV: 95.7 fL (ref 80.0–100.0)
Platelets: 285 10*3/uL (ref 150–400)
RBC: 4.16 MIL/uL — ABNORMAL LOW (ref 4.22–5.81)
RDW: 14.9 % (ref 11.5–15.5)
WBC: 8.8 10*3/uL (ref 4.0–10.5)
nRBC: 0 % (ref 0.0–0.2)

## 2021-01-18 LAB — SARS CORONAVIRUS 2 BY RT PCR (HOSPITAL ORDER, PERFORMED IN ~~LOC~~ HOSPITAL LAB): SARS Coronavirus 2: NEGATIVE

## 2021-01-18 LAB — SURGICAL PCR SCREEN
MRSA, PCR: NEGATIVE
Staphylococcus aureus: POSITIVE — AB

## 2021-01-18 SURGERY — LUMBAR LAMINECTOMY/DECOMPRESSION MICRODISCECTOMY 1 LEVEL
Anesthesia: General

## 2021-01-18 MED ORDER — METHYLPREDNISOLONE ACETATE 80 MG/ML IJ SUSP
INTRAMUSCULAR | Status: AC
  Filled 2021-01-18: qty 1

## 2021-01-18 MED ORDER — ESCITALOPRAM OXALATE 10 MG PO TABS
20.0000 mg | ORAL_TABLET | Freq: Every morning | ORAL | Status: DC
  Administered 2021-01-19 – 2021-01-24 (×6): 20 mg via ORAL
  Filled 2021-01-18 (×6): qty 2

## 2021-01-18 MED ORDER — MORPHINE SULFATE (PF) 2 MG/ML IV SOLN
2.0000 mg | INTRAVENOUS | Status: DC | PRN
  Administered 2021-01-20 – 2021-01-23 (×10): 2 mg via INTRAVENOUS
  Filled 2021-01-18 (×11): qty 1

## 2021-01-18 MED ORDER — CLONAZEPAM 0.5 MG PO TABS
0.5000 mg | ORAL_TABLET | Freq: Two times a day (BID) | ORAL | Status: DC
  Administered 2021-01-18 – 2021-01-24 (×12): 0.5 mg via ORAL
  Filled 2021-01-18 (×12): qty 1

## 2021-01-18 MED ORDER — ONDANSETRON HCL 4 MG/2ML IJ SOLN
INTRAMUSCULAR | Status: DC | PRN
  Administered 2021-01-18: 4 mg via INTRAVENOUS

## 2021-01-18 MED ORDER — FENTANYL CITRATE (PF) 100 MCG/2ML IJ SOLN
INTRAMUSCULAR | Status: AC
  Filled 2021-01-18: qty 2

## 2021-01-18 MED ORDER — HYDROCODONE-ACETAMINOPHEN 5-325 MG PO TABS
1.0000 | ORAL_TABLET | ORAL | Status: DC | PRN
  Administered 2021-01-19 – 2021-01-24 (×6): 1 via ORAL
  Filled 2021-01-18 (×7): qty 1

## 2021-01-18 MED ORDER — CHLORHEXIDINE GLUCONATE CLOTH 2 % EX PADS
6.0000 | MEDICATED_PAD | Freq: Every day | CUTANEOUS | Status: AC
  Administered 2021-01-19 – 2021-01-23 (×5): 6 via TOPICAL

## 2021-01-18 MED ORDER — PROPOFOL 10 MG/ML IV BOLUS
INTRAVENOUS | Status: AC
  Filled 2021-01-18: qty 20

## 2021-01-18 MED ORDER — SODIUM CHLORIDE 0.9 % IV SOLN: 250.0000 mL | INTRAVENOUS | Status: DC

## 2021-01-18 MED ORDER — ALUM & MAG HYDROXIDE-SIMETH 200-200-20 MG/5ML PO SUSP: 30.0000 mL | Freq: Four times a day (QID) | ORAL | Status: DC | PRN

## 2021-01-18 MED ORDER — ONDANSETRON HCL 4 MG PO TABS: 4.0000 mg | ORAL_TABLET | Freq: Four times a day (QID) | ORAL | Status: DC | PRN

## 2021-01-18 MED ORDER — PHENYLEPHRINE 40 MCG/ML (10ML) SYRINGE FOR IV PUSH (FOR BLOOD PRESSURE SUPPORT)
PREFILLED_SYRINGE | INTRAVENOUS | Status: DC | PRN
  Administered 2021-01-18: 40 ug via INTRAVENOUS

## 2021-01-18 MED ORDER — MUPIROCIN 2 % EX OINT
1.0000 "application " | TOPICAL_OINTMENT | Freq: Two times a day (BID) | CUTANEOUS | Status: AC
  Administered 2021-01-18 – 2021-01-23 (×10): 1 via NASAL
  Filled 2021-01-18 (×2): qty 22

## 2021-01-18 MED ORDER — HEPARIN SODIUM (PORCINE) 5000 UNIT/ML IJ SOLN
5000.0000 [IU] | Freq: Two times a day (BID) | INTRAMUSCULAR | Status: DC
  Administered 2021-01-19 – 2021-01-24 (×11): 5000 [IU] via SUBCUTANEOUS
  Filled 2021-01-18 (×11): qty 1

## 2021-01-18 MED ORDER — BUPIVACAINE-EPINEPHRINE (PF) 0.25% -1:200000 IJ SOLN
INTRAMUSCULAR | Status: AC
  Filled 2021-01-18: qty 30

## 2021-01-18 MED ORDER — ONDANSETRON HCL 4 MG/2ML IJ SOLN: 4.0000 mg | Freq: Four times a day (QID) | INTRAMUSCULAR | Status: DC | PRN

## 2021-01-18 MED ORDER — 0.9 % SODIUM CHLORIDE (POUR BTL) OPTIME
TOPICAL | Status: DC | PRN
  Administered 2021-01-18: 1000 mL

## 2021-01-18 MED ORDER — LIDOCAINE-EPINEPHRINE 1 %-1:100000 IJ SOLN
INTRAMUSCULAR | Status: AC
  Filled 2021-01-18: qty 1

## 2021-01-18 MED ORDER — OXYCODONE HCL 5 MG PO TABS
5.0000 mg | ORAL_TABLET | Freq: Once | ORAL | Status: DC | PRN
Start: 2021-01-18 — End: 2021-01-18

## 2021-01-18 MED ORDER — HYDROCODONE-ACETAMINOPHEN 10-325 MG PO TABS
1.0000 | ORAL_TABLET | ORAL | Status: DC | PRN
  Administered 2021-01-18 – 2021-01-24 (×12): 1 via ORAL
  Filled 2021-01-18 (×12): qty 1

## 2021-01-18 MED ORDER — CEFAZOLIN IN SODIUM CHLORIDE 3-0.9 GM/100ML-% IV SOLN
3.0000 g | INTRAVENOUS | Status: AC
  Administered 2021-01-18: 3 g via INTRAVENOUS
  Filled 2021-01-18: qty 100

## 2021-01-18 MED ORDER — LIDOCAINE 2% (20 MG/ML) 5 ML SYRINGE
INTRAMUSCULAR | Status: DC | PRN
  Administered 2021-01-18: 60 mg via INTRAVENOUS

## 2021-01-18 MED ORDER — THROMBIN 5000 UNITS EX SOLR
CUTANEOUS | Status: AC
  Filled 2021-01-18: qty 5000

## 2021-01-18 MED ORDER — ACETAMINOPHEN 10 MG/ML IV SOLN
INTRAVENOUS | Status: AC
  Filled 2021-01-18: qty 100

## 2021-01-18 MED ORDER — SODIUM CHLORIDE 0.9% FLUSH
3.0000 mL | Freq: Two times a day (BID) | INTRAVENOUS | Status: DC
  Administered 2021-01-18 – 2021-01-24 (×11): 3 mL via INTRAVENOUS

## 2021-01-18 MED ORDER — SUGAMMADEX SODIUM 200 MG/2ML IV SOLN
INTRAVENOUS | Status: DC | PRN
  Administered 2021-01-18: 300 mg via INTRAVENOUS

## 2021-01-18 MED ORDER — PHENOL 1.4 % MT LIQD: 1.0000 | OROMUCOSAL | Status: DC | PRN

## 2021-01-18 MED ORDER — ARIPIPRAZOLE 10 MG PO TABS
10.0000 mg | ORAL_TABLET | Freq: Every day | ORAL | Status: DC
  Administered 2021-01-18 – 2021-01-23 (×6): 10 mg via ORAL
  Filled 2021-01-18 (×6): qty 1

## 2021-01-18 MED ORDER — SENNOSIDES-DOCUSATE SODIUM 8.6-50 MG PO TABS: 1.0000 | ORAL_TABLET | Freq: Every evening | ORAL | Status: DC | PRN

## 2021-01-18 MED ORDER — ACETAMINOPHEN 325 MG PO TABS: 650.0000 mg | ORAL_TABLET | ORAL | Status: DC | PRN

## 2021-01-18 MED ORDER — DEXAMETHASONE SODIUM PHOSPHATE 10 MG/ML IJ SOLN
INTRAMUSCULAR | Status: DC | PRN
  Administered 2021-01-18: 10 mg via INTRAVENOUS

## 2021-01-18 MED ORDER — FENTANYL CITRATE (PF) 100 MCG/2ML IJ SOLN
25.0000 ug | INTRAMUSCULAR | Status: DC | PRN
  Administered 2021-01-18: 100 ug via INTRAVENOUS

## 2021-01-18 MED ORDER — ACETAMINOPHEN 650 MG RE SUPP: 650.0000 mg | RECTAL | Status: DC | PRN

## 2021-01-18 MED ORDER — ACETAMINOPHEN 10 MG/ML IV SOLN
INTRAVENOUS | Status: DC | PRN
  Administered 2021-01-18: 1000 mg via INTRAVENOUS

## 2021-01-18 MED ORDER — CHLORHEXIDINE GLUCONATE CLOTH 2 % EX PADS: 6.0000 | MEDICATED_PAD | Freq: Once | CUTANEOUS | Status: DC

## 2021-01-18 MED ORDER — METHOCARBAMOL 500 MG PO TABS
500.0000 mg | ORAL_TABLET | Freq: Four times a day (QID) | ORAL | Status: DC | PRN
  Administered 2021-01-19 – 2021-01-24 (×8): 500 mg via ORAL
  Filled 2021-01-18 (×10): qty 1

## 2021-01-18 MED ORDER — ROCURONIUM BROMIDE 10 MG/ML (PF) SYRINGE
PREFILLED_SYRINGE | INTRAVENOUS | Status: DC | PRN
  Administered 2021-01-18: 40 mg via INTRAVENOUS
  Administered 2021-01-18: 20 mg via INTRAVENOUS

## 2021-01-18 MED ORDER — ORAL CARE MOUTH RINSE: 15.0000 mL | Freq: Once | OROMUCOSAL | Status: AC

## 2021-01-18 MED ORDER — MIDAZOLAM HCL 2 MG/2ML IJ SOLN
INTRAMUSCULAR | Status: AC
  Filled 2021-01-18: qty 2

## 2021-01-18 MED ORDER — PHENYLEPHRINE 40 MCG/ML (10ML) SYRINGE FOR IV PUSH (FOR BLOOD PRESSURE SUPPORT)
PREFILLED_SYRINGE | INTRAVENOUS | Status: AC
  Filled 2021-01-18: qty 10

## 2021-01-18 MED ORDER — PROPOFOL 10 MG/ML IV BOLUS
INTRAVENOUS | Status: DC | PRN
  Administered 2021-01-18: 150 mg via INTRAVENOUS

## 2021-01-18 MED ORDER — CEFAZOLIN SODIUM-DEXTROSE 2-4 GM/100ML-% IV SOLN
2.0000 g | INTRAVENOUS | Status: DC
  Filled 2021-01-18: qty 100

## 2021-01-18 MED ORDER — FENTANYL CITRATE (PF) 250 MCG/5ML IJ SOLN
INTRAMUSCULAR | Status: DC | PRN
  Administered 2021-01-18: 150 ug via INTRAVENOUS
  Administered 2021-01-18 (×2): 25 ug via INTRAVENOUS

## 2021-01-18 MED ORDER — LACTATED RINGERS IV SOLN: INTRAVENOUS | Status: DC

## 2021-01-18 MED ORDER — SUCCINYLCHOLINE CHLORIDE 20 MG/ML IJ SOLN
INTRAMUSCULAR | Status: DC | PRN
  Administered 2021-01-18: 150 mg via INTRAVENOUS

## 2021-01-18 MED ORDER — LIDOCAINE-EPINEPHRINE 1 %-1:100000 IJ SOLN
INTRAMUSCULAR | Status: DC | PRN
  Administered 2021-01-18: 10 mL
  Administered 2021-01-18: 5 mL

## 2021-01-18 MED ORDER — MIDAZOLAM HCL 5 MG/5ML IJ SOLN
INTRAMUSCULAR | Status: DC | PRN
  Administered 2021-01-18: 1 mg via INTRAVENOUS

## 2021-01-18 MED ORDER — CEFAZOLIN SODIUM-DEXTROSE 2-4 GM/100ML-% IV SOLN
2.0000 g | Freq: Three times a day (TID) | INTRAVENOUS | Status: AC
  Administered 2021-01-18 – 2021-01-19 (×2): 2 g via INTRAVENOUS
  Filled 2021-01-18 (×2): qty 100

## 2021-01-18 MED ORDER — CHLORHEXIDINE GLUCONATE 0.12 % MT SOLN
15.0000 mL | Freq: Once | OROMUCOSAL | Status: AC
  Administered 2021-01-18: 15 mL via OROMUCOSAL
  Filled 2021-01-18: qty 15

## 2021-01-18 MED ORDER — THROMBIN 5000 UNITS EX SOLR
OROMUCOSAL | Status: DC | PRN
  Administered 2021-01-18: 5 mL

## 2021-01-18 MED ORDER — LABETALOL HCL 5 MG/ML IV SOLN
INTRAVENOUS | Status: DC | PRN
  Administered 2021-01-18 (×2): 5 mg via INTRAVENOUS

## 2021-01-18 MED ORDER — GELATIN ABSORBABLE 12-7 MM EX MISC
CUTANEOUS | Status: DC | PRN
  Administered 2021-01-18: 1

## 2021-01-18 MED ORDER — OXYCODONE HCL 5 MG/5ML PO SOLN
5.0000 mg | Freq: Once | ORAL | Status: DC | PRN
Start: 2021-01-18 — End: 2021-01-18

## 2021-01-18 MED ORDER — METHOCARBAMOL 1000 MG/10ML IJ SOLN
500.0000 mg | Freq: Four times a day (QID) | INTRAMUSCULAR | Status: DC | PRN
  Filled 2021-01-18: qty 5

## 2021-01-18 MED ORDER — FENTANYL CITRATE (PF) 250 MCG/5ML IJ SOLN
INTRAMUSCULAR | Status: AC
  Filled 2021-01-18: qty 5

## 2021-01-18 MED ORDER — LACTATED RINGERS IV SOLN: INTRAVENOUS | Status: DC | PRN

## 2021-01-18 MED ORDER — BUPIVACAINE-EPINEPHRINE (PF) 0.5% -1:200000 IJ SOLN
INTRAMUSCULAR | Status: DC | PRN
Start: 2021-01-18 — End: 2021-01-18
  Administered 2021-01-18: 5 mL via PERINEURAL
  Administered 2021-01-18: 10 mL via PERINEURAL

## 2021-01-18 MED ORDER — PANTOPRAZOLE SODIUM 40 MG PO TBEC
40.0000 mg | DELAYED_RELEASE_TABLET | Freq: Two times a day (BID) | ORAL | Status: DC
  Administered 2021-01-18 – 2021-01-24 (×12): 40 mg via ORAL
  Filled 2021-01-18 (×14): qty 1

## 2021-01-18 MED ORDER — MENTHOL 3 MG MT LOZG: 1.0000 | LOZENGE | OROMUCOSAL | Status: DC | PRN

## 2021-01-18 MED ORDER — SODIUM CHLORIDE 0.9 % IV SOLN: INTRAVENOUS | Status: DC

## 2021-01-18 MED ORDER — SODIUM CHLORIDE 0.9% FLUSH: 3.0000 mL | INTRAVENOUS | Status: DC | PRN

## 2021-01-18 SURGICAL SUPPLY — 60 items
BAND RUBBER #18 3X1/16 STRL (MISCELLANEOUS) ×4 IMPLANT
BUR CARBIDE MATCH 3.0 (BURR) ×4 IMPLANT
CARTRIDGE OIL MAESTRO DRILL (MISCELLANEOUS) ×1 IMPLANT
CNTNR URN SCR LID CUP LEK RST (MISCELLANEOUS) ×1 IMPLANT
CONT SPEC 4OZ STRL OR WHT (MISCELLANEOUS) ×1
COVER MAYO STAND STRL (DRAPES) ×2 IMPLANT
COVER WAND RF STERILE (DRAPES) ×2 IMPLANT
DECANTER SPIKE VIAL GLASS SM (MISCELLANEOUS) ×2 IMPLANT
DERMABOND ADVANCED (GAUZE/BANDAGES/DRESSINGS) ×1
DERMABOND ADVANCED .7 DNX12 (GAUZE/BANDAGES/DRESSINGS) ×1 IMPLANT
DIFFUSER DRILL AIR PNEUMATIC (MISCELLANEOUS) ×2 IMPLANT
DRAIN JACKSON RD 7FR 3/32 (WOUND CARE) IMPLANT
DRAPE C-ARM 42X72 X-RAY (DRAPES) ×2 IMPLANT
DRAPE LAPAROTOMY 100X72X124 (DRAPES) ×2 IMPLANT
DRAPE MICROSCOPE LEICA (MISCELLANEOUS) ×2 IMPLANT
DRAPE SURG 17X23 STRL (DRAPES) ×2 IMPLANT
DRSG OPSITE POSTOP 4X6 (GAUZE/BANDAGES/DRESSINGS) ×2 IMPLANT
DURAPREP 26ML APPLICATOR (WOUND CARE) ×2 IMPLANT
ELECT BLADE INSULATED 4IN (ELECTROSURGICAL) ×2
ELECT COATED BLADE 2.86 ST (ELECTRODE) ×2 IMPLANT
ELECT REM PT RETURN 9FT ADLT (ELECTROSURGICAL) ×2
ELECTRODE BLADE INSULATED 4IN (ELECTROSURGICAL) ×1 IMPLANT
ELECTRODE REM PT RTRN 9FT ADLT (ELECTROSURGICAL) ×1 IMPLANT
GAUZE 4X4 16PLY RFD (DISPOSABLE) IMPLANT
GAUZE SPONGE 4X4 12PLY STRL (GAUZE/BANDAGES/DRESSINGS) ×2 IMPLANT
GLOVE ECLIPSE 8.0 STRL XLNG CF (GLOVE) ×2 IMPLANT
GLOVE ECLIPSE 9.0 STRL (GLOVE) ×2 IMPLANT
GLOVE SRG 8 PF TXTR STRL LF DI (GLOVE) ×2 IMPLANT
GLOVE SURG UNDER POLY LF SZ8 (GLOVE) ×2
GOWN STRL REUS W/ TWL LRG LVL3 (GOWN DISPOSABLE) ×2 IMPLANT
GOWN STRL REUS W/ TWL XL LVL3 (GOWN DISPOSABLE) ×1 IMPLANT
GOWN STRL REUS W/TWL 2XL LVL3 (GOWN DISPOSABLE) IMPLANT
GOWN STRL REUS W/TWL LRG LVL3 (GOWN DISPOSABLE) ×2
GOWN STRL REUS W/TWL XL LVL3 (GOWN DISPOSABLE) ×1
HEMOSTAT POWDER KIT SURGIFOAM (HEMOSTASIS) ×2 IMPLANT
KIT BASIN OR (CUSTOM PROCEDURE TRAY) ×2 IMPLANT
KIT POSITION SURG JACKSON T1 (MISCELLANEOUS) ×2 IMPLANT
KIT TURNOVER KIT B (KITS) ×2 IMPLANT
MARKER SKIN DUAL TIP RULER LAB (MISCELLANEOUS) ×2 IMPLANT
NEEDLE HYPO 25X1 1.5 SAFETY (NEEDLE) ×2 IMPLANT
NEEDLE SPNL 18GX3.5 QUINCKE PK (NEEDLE) ×4 IMPLANT
NS IRRIG 1000ML POUR BTL (IV SOLUTION) ×2 IMPLANT
OIL CARTRIDGE MAESTRO DRILL (MISCELLANEOUS) ×2
PACK LAMINECTOMY NEURO (CUSTOM PROCEDURE TRAY) ×2 IMPLANT
PAD ARMBOARD 7.5X6 YLW CONV (MISCELLANEOUS) ×6 IMPLANT
PATTIES SURGICAL .5 X.5 (GAUZE/BANDAGES/DRESSINGS) IMPLANT
PATTIES SURGICAL .5 X1 (DISPOSABLE) IMPLANT
PATTIES SURGICAL 1X1 (DISPOSABLE) IMPLANT
SPONGE LAP 4X18 RFD (DISPOSABLE) IMPLANT
SPONGE SURGIFOAM ABS GEL 12-7 (HEMOSTASIS) ×2 IMPLANT
SUT ETHILON 3 0 PS 1 (SUTURE) ×2 IMPLANT
SUT VIC AB 0 CT1 18XCR BRD8 (SUTURE) ×1 IMPLANT
SUT VIC AB 0 CT1 27 (SUTURE) ×1
SUT VIC AB 0 CT1 27XBRD ANBCTR (SUTURE) ×1 IMPLANT
SUT VIC AB 0 CT1 8-18 (SUTURE) ×1
SUT VIC AB 2-0 CP2 18 (SUTURE) ×2 IMPLANT
SUT VIC AB 3-0 SH 8-18 (SUTURE) ×2 IMPLANT
TOWEL GREEN STERILE (TOWEL DISPOSABLE) IMPLANT
TOWEL GREEN STERILE FF (TOWEL DISPOSABLE) IMPLANT
WATER STERILE IRR 1000ML POUR (IV SOLUTION) ×2 IMPLANT

## 2021-01-18 NOTE — Progress Notes (Signed)
   Providing Compassionate, Quality Care - Together  NEUROSURGERY PROGRESS NOTE   S: Seen and examined in the recovery room, remains intubated  O: EXAM:  BP (!) 101/44   Pulse 68   Temp 98 F (36.7 C)   Resp (!) 8   Ht 5\' 11"  (1.803 m)   Wt 133.8 kg   SpO2 97%   BMI 41.14 kg/m   Awake, alert Intubated Follow commands x4 Bilateral lower extremity distal 3/5, proximal 2/5  ASSESSMENT:  65 y.o. male with   1.  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  Please do not hesitate to call with questions or concerns.   Elwin Sleight, [facility] Cell: [phone]

## 2021-01-18 NOTE — H&P (Signed)
Providing Compassionate, Quality Care - Together  NEUROSURGERY HISTORY & PHYSICAL   Jimmy Le is an 65 y.o. male.   Chief Complaint: BLE Weakness HPI: This is a 65 yo M with a history of LE weakness, numbness and tingling with bowel incontinence and difficulty walking for 8 years. He progressed to needing a wheelchair from a walker in October of 2021. He was seen by neurology and found to have severe T11-12 stenosis with cord signal change. I offered him T11-12 laminectomy given his continued progression despite his significant weakness in order to preserve his remaining function. He presents today for surgery.  Past Medical History:  Diagnosis Date  . Anemia   . Anxiety and depression   . Arthritis   . Atherosclerosis of aorta ([place])   . COPD (chronic obstructive pulmonary disease) ([place])   . Dyspnea   . Emphysema lung ([place])   . GERD (gastroesophageal reflux disease)   . HOH (hard of hearing)   . Idiopathic peripheral neuropathy   . MRSA infection    H/O  . Neuropathy   . Osteoarthritis   . Paresis of lower extremity ([place])   . Tremors of nervous system   . Wheezing     Past Surgical History:  Procedure Laterality Date  . APPENDECTOMY  1966  . CATARACT EXTRACTION W/PHACO Left 05/10/2017   Procedure: CATARACT EXTRACTION PHACO AND INTRAOCULAR LENS PLACEMENT (IOC);  Surgeon: Eulogio Bear, MD;  Location: [facility] ORS;  Service: Ophthalmology;  Laterality: Left;  [place] 00:24.4 AP% 7.0 CDE 1.72 FLUID PACK LOT # [id number]  . CATARACT EXTRACTION W/PHACO Right 06/07/2017   Procedure: CATARACT EXTRACTION PHACO AND INTRAOCULAR LENS PLACEMENT (IOC);  Surgeon: Eulogio Bear, MD;  Location: [facility] ORS;  Service: Ophthalmology;  Laterality: Right;  [place] 00:22.6 AP% 9.2 CDE 2.07 FLUID PACK LOT # [id number]  . COLONOSCOPY    . EYE SURGERY    . FOOT SURGERY Bilateral 2010  . HERNIA REPAIR  1995  . RCR Right 2010    Family History  Problem Relation Age of Onset  . Hepatitis Mother    . Hyperlipidemia Mother   . Hypertension Mother   . Prostate cancer Father   . Stroke Father   . COPD Brother   . Emphysema Brother   . Hypertension Brother   . Heart attack Brother   . Emphysema Maternal Grandmother   . Stroke Maternal Grandmother   . Hyperlipidemia Maternal Grandmother   . Hypertension Maternal Grandmother   . Heart attack Maternal Grandmother   . Hyperlipidemia Paternal Grandmother   . Arthritis Paternal Grandmother    Social History:  reports that he has been smoking. He has a 33.75 pack-year smoking history. He has never used smokeless tobacco. He reports that he does not drink alcohol and does not use drugs.  Allergies: No Known Allergies  Medications Prior to Admission  Medication Sig Dispense Refill  . ARIPiprazole (ABILIFY) 10 MG tablet Take 10 mg by mouth at bedtime.    . cholecalciferol (VITAMIN D) 25 MCG (1000 UNIT) tablet Take 1,000 Units by mouth in the morning.    . clonazePAM (KLONOPIN) 0.5 MG tablet Take 0.5 mg by mouth 2 (two) times daily.    Marland Kitchen escitalopram (LEXAPRO) 20 MG tablet Take 20 mg by mouth in the morning.    . ferrous sulfate 325 (65 FE) MG tablet Take 325 mg by mouth in the morning.    . pantoprazole (PROTONIX) 40 MG tablet Take 40 mg by  mouth 2 (two) times daily.      Results for orders placed or performed during the hospital encounter of 01/18/21 (from the past 48 hour(s))  SARS Coronavirus 2 by RT PCR (hospital order, performed in [facility] hospital lab) Nasopharyngeal Nasopharyngeal Swab     Status: None   Collection Time: 01/18/21  8:40 AM   Specimen: Nasopharyngeal Swab  Result Value Ref Range   SARS Coronavirus 2 NEGATIVE NEGATIVE    Comment: (NOTE) SARS-CoV-2 target nucleic acids are NOT DETECTED.  The SARS-CoV-2 RNA is generally detectable in upper and lower respiratory specimens during the acute phase of infection. The lowest concentration of SARS-CoV-2 viral copies this assay can detect is 250 copies / mL. A  negative result does not preclude SARS-CoV-2 infection and should not be used as the sole basis for treatment or other patient management decisions.  A negative result may occur with improper specimen collection / handling, submission of specimen other than nasopharyngeal swab, presence of viral mutation(s) within the areas targeted by this assay, and inadequate number of viral copies (<250 copies / mL). A negative result must be combined with clinical observations, patient history, and epidemiological information.  Fact Sheet for Patients:   StrictlyIdeas.no  Fact Sheet for Healthcare Providers: BankingDealers.co.za  This test is not yet approved or  cleared by the [place] FDA and has been authorized for detection and/or diagnosis of SARS-CoV-2 by FDA under an Emergency Use Authorization (EUA).  This EUA will remain in effect (meaning this test can be used) for the duration of the COVID-19 declaration under Section 564(b)(1) of the Act, 21 U.S.C. section 360bbb-3(b)(1), unless the authorization is terminated or revoked sooner.  Performed at [facility], [place] [address] [place], [place] [address]    No results found.  ROS 14 pt review done, all +/- in HPI  Blood pressure 140/65, pulse 61, temperature 97.7 F (36.5 C), temperature source Oral, resp. rate 18, height 5\' 11"  (1.803 m), weight 133.8 kg, SpO2 95 %. Physical Exam   AOx3 PERRL NAD BUE 4/5 BLE HF 2/5, KE 2/5, DF/PF 3/5 Decreased sensation in BLE from mid thigh down  Assessment/Plan 65 yo M with:  1. 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     Thank you for allowing me to participate in this patient's care.  Please do not  hesitate to call with questions or concerns.   Elwin Sleight, [facility]

## 2021-01-18 NOTE — Anesthesia Procedure Notes (Signed)
Procedure Name: Intubation Date/Time: 01/18/2021 11:03 AM Performed by: Gaylene Brooks, CRNA Pre-anesthesia Checklist: Patient identified, Emergency Drugs available, Suction available and Patient being monitored Patient Re-evaluated:Patient Re-evaluated prior to induction Oxygen Delivery Method: Circle System Utilized Preoxygenation: Pre-oxygenation with 100% oxygen Induction Type: IV induction Ventilation: Mask ventilation without difficulty and Oral airway inserted - appropriate to patient size Laryngoscope Size: Sabra Heck and 2 Grade View: Grade I Tube type: Oral Tube size: 7.5 mm Number of attempts: 1 Airway Equipment and Method: Stylet and Oral airway Placement Confirmation: ETT inserted through vocal cords under direct vision,  positive ETCO2 and breath sounds checked- equal and bilateral Secured at: 22 cm Tube secured with: Tape Dental Injury: Teeth and Oropharynx as per pre-operative assessment

## 2021-01-18 NOTE — Plan of Care (Signed)

## 2021-01-18 NOTE — Progress Notes (Signed)
Jimmy Le is a 65 y.o. male patient transferred  from PACU awake, alert  & orientated  X 4  Full Code, VSS refer to flow sheet at 1530.  No c/o shortness of breath, no c/o chest pain, no distress noted.:   IV site WDL:  with a transparent dsg that's clean dry and intact  Pt orientation to unit, room and routine. Information packet given to patient.  Admission INP armband ID verified with patient, and in place, fall risk assessment complete with Patient and he verbalized understanding of risks associated with falls. Pt verbalizes an understanding of how to use the call bell and to call for help before getting out of bed.  Skin, clean-dry- intact without evidence of bruising, or skin tears.  Honeycomb dressing intact  On patient lower back with no drainage noted. No evidence of skin break down noted on exam  Will cont to monitor and assist as needed.  Dorris Carnes, RN 01/18/2021 8:08 PM

## 2021-01-18 NOTE — Transfer of Care (Signed)
Immediate Anesthesia Transfer of Care Note  Patient: Jimmy Le  Procedure(s) Performed: Thoracic eleven-twelve Open Laminectomy (N/A )  Patient Location: PACU  Anesthesia Type:General  Level of Consciousness: awake and responds to stimulation  Airway & Oxygen Therapy: Patient remains intubated per anesthesia plan and Patient placed on Ventilator (see vital sign flow sheet for setting)  Post-op Assessment: Report given to RN, Post -op Vital signs reviewed and stable and Patient moving all extremities X 4  Post vital signs: Reviewed and stable  Last Vitals:  Vitals Value Taken Time  BP 161/113 01/18/21 1328  Temp    Pulse 84 01/18/21 1330  Resp 25 01/18/21 1330  SpO2 98 % 01/18/21 1330  Vitals shown include unvalidated device data.  Last Pain:  Vitals:   01/18/21 0927  TempSrc:   PainSc: 0-No pain      Patients Stated Pain Goal: 3 (00/51/10 2111)  Complications: No complications documented.

## 2021-01-18 NOTE — Op Note (Signed)
Providing Compassionate, Quality Care - Together  Date of service: 01/18/2021  PREOP DIAGNOSIS:  1. 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 Open bilateral T11, T12 laminectomy, bilateral partial medial facetectomy for decompression of the spinal cord 2. Intraoperative use of fluoroscopy less than 1 hour 3. Intraoperative use of microscope for microdissection  SURGEON: Dr. Pieter Partridge C. Georga Stys, DO  ASSISTANT: Dr. Kristeen Miss, MD  ANESTHESIA: General Endotracheal  EBL: 50 cc  SPECIMENS: None  DRAINS: None  COMPLICATIONS: None  CONDITION: Hemodynamically stable  HISTORY: Jimmy Le is a 65 y.o. male who presented to my office with 8 years of progressive lower extremity numbness, weakness, tingling and bowel incontinence.  He was seen by neurologist who ordered an MRI that revealed T11-12 severe stenosis with cord signal change as well as calcification of the bilateral ligamentum flavum.  He unfortunately is wheelchair-bound due to his significant weakness, which had progressed in October/November 2021 from a walker.  We discussed surgical intervention given his slight remaining motor function and sensation in order to preserve his function.  We discussed all risks, benefits and expected outcomes of an open decompression, I answered all of his questions with his wife as well.  They agreed to proceed with surgical intervention.  PROCEDURE IN DETAIL: The patient was brought to the operating room. After induction of general anesthesia, the patient was positioned on the operative table in the prone position. All pressure points were meticulously padded. Skin incision was then marked out and prepped and draped in the usual sterile fashion using AP and lateral fluoroscopy.  Physician driven timeout was performed.  Local anesthetic was injected into the planned incision.  A midline incision was created with a 10 blade sharply down to the  thoracolumbar fascia.  Self-retaining retractor was placed in the wound.  Using Bovie electrocautery, subperiosteal dissection was performed bilaterally to expose the T11 and T12 lamina and medial facets.  Deep retractors were placed in the wound.  Using AP and lateral fluoroscopy, the appropriate level was confirmed.  The microscope was sterilely draped and brought into the field for the remainder of the case.  Using a high-speed drill a bilateral T11 laminectomy was performed up to the ligamentous attachment of the ligamentum flavum.  A medial facetectomy was performed with a high-speed drill bilaterally as well.  The ligamentum flavum was noted to be significantly calcified with severe stenosis of the spinal cord.  The superior half of T12 lamina was drilled bilaterally with a high-speed drill.  The epidural space was identified.  Using micro curettes and Kerrisons, the ligamentum flavum was resected bilaterally and the medial facetectomy was completed.  At this point the spinal cord was decompressed and pulsatile.  Ligamentum flavum and medial facetectomy was performed to the lateral recess.  Using microcurette, the lateral recess was felt to be adequately decompressed bilaterally.  Again the spinal cord was noted to be pulsatile.  Epidural hemostasis was achieved with Surgiflo.  Deep retractors were taken out of the wound.  Hemostasis was achieved with bipolar cautery, the wound was explored and irrigated and noted to be excellently hemostatic.  The wound was then closed in layers, 0 Vicryl sutures for muscle and fascia.  2-0 Vicryl 3-0 Vicryl for the dermal layer.  Skin was then closed with Dermabond glue.  Sterile dressing was applied.   At the end of the case all sponge, needle, and instrument counts were correct. The patient was then transferred to  the stretcher, extubated, and taken to the post-anesthesia care unit in stable hemodynamic condition.

## 2021-01-18 NOTE — Anesthesia Preprocedure Evaluation (Signed)
Anesthesia Evaluation  Patient identified by MRN, date of birth, ID band Patient awake    Reviewed: Allergy & Precautions, H&P , NPO status , Patient's Chart, lab work & pertinent test results  Airway Mallampati: II   Neck ROM: full    Dental   Pulmonary shortness of breath, COPD, Current Smoker and Patient abstained from smoking.,    breath sounds clear to auscultation       Cardiovascular negative cardio ROS   Rhythm:regular Rate:Normal     Neuro/Psych PSYCHIATRIC DISORDERS Anxiety Depression  Neuromuscular disease    GI/Hepatic GERD  ,  Endo/Other  Morbid obesity  Renal/GU      Musculoskeletal  (+) Arthritis ,   Abdominal   Peds  Hematology   Anesthesia Other Findings   Reproductive/Obstetrics                             Anesthesia Physical Anesthesia Plan  ASA: II  Anesthesia Plan: General   Post-op Pain Management:    Induction: Intravenous  PONV Risk Score and Plan: 1 and Ondansetron, Dexamethasone, Midazolam and Treatment may vary due to age or medical condition  Airway Management Planned: Oral ETT  Additional Equipment:   Intra-op Plan:   Post-operative Plan: Extubation in OR  Informed Consent: I have reviewed the patients History and Physical, chart, labs and discussed the procedure including the risks, benefits and alternatives for the proposed anesthesia with the patient or authorized representative who has indicated his/her understanding and acceptance.     Dental advisory given  Plan Discussed with: CRNA, Anesthesiologist and Surgeon  Anesthesia Plan Comments:         Anesthesia Quick Evaluation

## 2021-01-19 ENCOUNTER — Encounter (HOSPITAL_COMMUNITY): Payer: Self-pay | Admitting: Neurological Surgery

## 2021-01-19 DIAGNOSIS — R252 Cramp and spasm: Secondary | ICD-10-CM | POA: Diagnosis not present

## 2021-01-19 DIAGNOSIS — F329 Major depressive disorder, single episode, unspecified: Secondary | ICD-10-CM | POA: Diagnosis present

## 2021-01-19 DIAGNOSIS — F419 Anxiety disorder, unspecified: Secondary | ICD-10-CM | POA: Diagnosis present

## 2021-01-19 DIAGNOSIS — R159 Full incontinence of feces: Secondary | ICD-10-CM | POA: Diagnosis present

## 2021-01-19 DIAGNOSIS — K592 Neurogenic bowel, not elsewhere classified: Secondary | ICD-10-CM | POA: Diagnosis present

## 2021-01-19 DIAGNOSIS — Z79899 Other long term (current) drug therapy: Secondary | ICD-10-CM | POA: Diagnosis not present

## 2021-01-19 DIAGNOSIS — Z83438 Family history of other disorder of lipoprotein metabolism and other lipidemia: Secondary | ICD-10-CM | POA: Diagnosis not present

## 2021-01-19 DIAGNOSIS — Z8614 Personal history of Methicillin resistant Staphylococcus aureus infection: Secondary | ICD-10-CM | POA: Diagnosis not present

## 2021-01-19 DIAGNOSIS — Z8042 Family history of malignant neoplasm of prostate: Secondary | ICD-10-CM | POA: Diagnosis not present

## 2021-01-19 DIAGNOSIS — F17210 Nicotine dependence, cigarettes, uncomplicated: Secondary | ICD-10-CM | POA: Diagnosis present

## 2021-01-19 DIAGNOSIS — G8220 Paraplegia, unspecified: Secondary | ICD-10-CM

## 2021-01-19 DIAGNOSIS — Z8249 Family history of ischemic heart disease and other diseases of the circulatory system: Secondary | ICD-10-CM | POA: Diagnosis not present

## 2021-01-19 DIAGNOSIS — Z825 Family history of asthma and other chronic lower respiratory diseases: Secondary | ICD-10-CM | POA: Diagnosis not present

## 2021-01-19 DIAGNOSIS — K219 Gastro-esophageal reflux disease without esophagitis: Secondary | ICD-10-CM | POA: Diagnosis present

## 2021-01-19 DIAGNOSIS — H9190 Unspecified hearing loss, unspecified ear: Secondary | ICD-10-CM | POA: Diagnosis present

## 2021-01-19 DIAGNOSIS — M4714 Other spondylosis with myelopathy, thoracic region: Principal | ICD-10-CM

## 2021-01-19 DIAGNOSIS — M4804 Spinal stenosis, thoracic region: Secondary | ICD-10-CM | POA: Diagnosis present

## 2021-01-19 DIAGNOSIS — Z993 Dependence on wheelchair: Secondary | ICD-10-CM | POA: Diagnosis not present

## 2021-01-19 DIAGNOSIS — J439 Emphysema, unspecified: Secondary | ICD-10-CM | POA: Diagnosis present

## 2021-01-19 DIAGNOSIS — Z20822 Contact with and (suspected) exposure to covid-19: Secondary | ICD-10-CM | POA: Diagnosis present

## 2021-01-19 DIAGNOSIS — I700 Atherosclerosis of aorta: Secondary | ICD-10-CM | POA: Diagnosis present

## 2021-01-19 DIAGNOSIS — M1990 Unspecified osteoarthritis, unspecified site: Secondary | ICD-10-CM | POA: Diagnosis present

## 2021-01-19 DIAGNOSIS — Z823 Family history of stroke: Secondary | ICD-10-CM | POA: Diagnosis not present

## 2021-01-19 DIAGNOSIS — Z6841 Body Mass Index (BMI) 40.0 and over, adult: Secondary | ICD-10-CM | POA: Diagnosis not present

## 2021-01-19 NOTE — Progress Notes (Signed)
Inpatient Rehab Admissions Coordinator:   Consult received.  I met with patient at bedside to discuss goals and expectations of CIR admit.  Per discussion with patient he appears to be significantly debilitated at baseline with decreased caregiver support (reports wife has back trouble as well, though not as significant).  Will discuss with rehab MD in the AM and call wife tomorrow to discuss CIR and verify caregiver support.    , PT, DPT Admissions Coordinator [phone] 01/19/21  4:31 PM   

## 2021-01-19 NOTE — Evaluation (Signed)
Occupational Therapy Evaluation Patient Details Name: Jimmy Le MRN: [id number] DOB: 17-Jun-1956 Today's Date: 01/19/2021    History of Present Illness Pt is a 65 y.o. 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 PMH includes COPD, neuropathy, tremors, OA, anxiety, depression, HOH.   Clinical Impression   Patient is s/p t11-12 laminectomy with cord compression surgery resulting in functional limitations due to the deficits listed below (see OT problem list). Pt currently has incontinence of bladder and bowel. Pt was walking with RW last Wednesday the length of his home home therapy. Pt today requires bil LE blocking of knees to complete sit<>stand total +2 max (A) from elevate surface. Pt reports when in wheelchair they self purchased at home he is dependent on others to move him. Pt could benefit from a power chair with hand controls to help pt be more independent.  Patient will benefit from skilled OT acutely to increase independence and safety with ADLS to allow discharge CIR.     Follow Up Recommendations  CIR    Equipment Recommendations  Other (comment) (power wheelchair with hand control)    Recommendations for Other Services Rehab consult     Precautions / Restrictions Precautions Precautions: Fall      Mobility Bed Mobility Overal bed mobility: Needs Assistance Bed Mobility: Rolling;Supine to Sit;Sit to Supine Rolling: +2 for physical assistance;Max assist   Supine to sit: +2 for physical assistance;Mod assist Sit to supine: +2 for physical assistance;Max assist   General bed mobility comments: pt is able to move L LE toward R side of the bed with pad used to help pivot toward EOB. pt using bed rail to help pull body toward R side of the bed with HOB elevated. pt with total +2 mod to progres to sitting. pt  sitting min guard (A). Pt with a very wide sitting position with decreased hip adduction noted. pt noted to have edema at scrotum at this time. pt requires control of trunk to sit<>supine but following all cues total +2 max (A)    Transfers Overall transfer level: Needs assistance Equipment used: 2 person hand held assist Transfers: Sit to/from Stand Sit to Stand: +2 physical assistance;Max assist;From elevated surface         General transfer comment: pt with pad and gait belt used with counting rocking momentum elevated bed surface to stand with bil LE blocked. pt requires complete blocking bil LE. wife reports pt walked the length of their home last wednesday with RW and home therapist    Balance Overall balance assessment: Needs assistance Sitting-balance support: Bilateral upper extremity supported;Feet supported Sitting balance-Leahy Scale: Fair     Standing balance support: Bilateral upper extremity supported;During functional activity Standing balance-Leahy Scale: Zero                             ADL either performed or assessed with clinical judgement   ADL Overall ADL's : Needs assistance/impaired Eating/Feeding: Minimal assistance;Bed level Eating/Feeding Details (indicate cue type and reason): tremor noted with eating with normal utensils. pt has been using weighted at home Grooming: Wash/dry face;Set up;Bed level   Upper Body Bathing: Moderate assistance;Bed level   Lower Body Bathing: Total assistance;Bed level           Toilet Transfer: Total assistance Toilet Transfer Details (indicate  cue type and reason): bed level care requires due to inabiity to transfer at this time safely. Pt has 3n1 at home but has been using depends due to incontinence from spinal compression           General ADL Comments: wife reports that care has become more bed level recently and that home therapist helped push for MRI that found compression.     Vision          Perception     Praxis      Pertinent Vitals/Pain Pain Assessment: 0-10 Pain Score: 5  Pain Location: back Pain Descriptors / Indicators: Operative site guarding Pain Intervention(s): Monitored during session;Premedicated before session;Repositioned     Hand Dominance Right   Extremity/Trunk Assessment Upper Extremity Assessment Upper Extremity Assessment: LUE deficits/detail;RUE deficits/detail RUE Deficits / Details: tremors RUE Coordination: decreased fine motor LUE Deficits / Details: tremors LUE Coordination: decreased fine motor   Lower Extremity Assessment Lower Extremity Assessment: RLE deficits/detail;LLE deficits/detail RLE Sensation: decreased light touch LLE Deficits / Details: sensation noted at knee, unable to feel light touch on foot LLE Sensation: decreased light touch   Cervical / Trunk Assessment Cervical / Trunk Assessment: Other exceptions Cervical / Trunk Exceptions: s/p surgery   Communication Communication Communication: No difficulties   Cognition Arousal/Alertness: Awake/alert Behavior During Therapy: WFL for tasks assessed/performed Overall Cognitive Status: Within Functional Limits for tasks assessed                                     General Comments  incision dry and intact    Exercises Exercises: Other exercises Other Exercises Other Exercises: positioned with linen at knees to help with adduction of knees toward midline for positioning to decrease "frog sit" positioning   Shoulder Instructions      Home Living Family/patient expects to be discharged to:: Private residence Living Arrangements: Spouse/significant other Available Help at Discharge: Family Type of Home: Mobile home Home Access: Ramped entrance     Home Layout: One level     Bathroom Shower/Tub: Occupational psychologist: Handicapped height Bathroom Accessibility: Yes How Accessible: Accessible via wheelchair Home Equipment:  Wheelchair - manual;Other (comment);Hospital bed;Bedside commode;Shower seat;Hand held shower head;Grab bars - tub/shower;Grab bars - toilet;Walker - 2 wheels (hoyer lift, lift chair)   Additional Comments: son, 2 cats 2 pitbulls chicken/ rosters      Prior Functioning/Environment Level of Independence: Needs assistance  Gait / Transfers Assistance Needed: using w/c since november, on good day sits on side of bed and uses walker to lift chair ADL's / Homemaking Assistance Needed: able to self feed with weighted utensils, bed level bath with wife, uses pads / depends for toileting   Comments: OT/PT have been seeing since Nov. 2021 2 times per week each ( 4visits in a week) - bed sores had started to occur in nov due to prolonged positioning. Son helps with all lifting. wife has hx of back issues too        OT Problem List: Decreased strength;Decreased range of motion;Decreased activity tolerance;Impaired balance (sitting and/or standing);Pain;Decreased knowledge of precautions;Decreased knowledge of use of DME or AE;Decreased safety awareness      OT Treatment/Interventions: Self-care/ADL training;Therapeutic exercise;Neuromuscular education;Energy conservation;DME and/or AE instruction;Manual therapy;Modalities;Therapeutic activities;Patient/family education;Balance training    OT Goals(Current goals can be found in the care plan section) Acute Rehab OT Goals Patient Stated Goal: to be able to walk  OT Goal Formulation: With patient/family Time For Goal Achievement: 02/02/21 Potential to Achieve Goals: Good  OT Frequency: Min 2X/week   Barriers to D/C:            Co-evaluation PT/OT/SLP Co-Evaluation/Treatment: Yes Reason for Co-Treatment: Complexity of the patient's impairments (multi-system involvement);For patient/therapist safety;To address functional/ADL transfers   OT goals addressed during session: ADL's and self-care;Proper use of Adaptive equipment and  DME;Strengthening/ROM      AM-PAC OT "6 Clicks" Daily Activity     Outcome Measure Help from another person eating meals?: A Little Help from another person taking care of personal grooming?: A Little Help from another person toileting, which includes using toliet, bedpan, or urinal?: Total Help from another person bathing (including washing, rinsing, drying)?: Total Help from another person to put on and taking off regular upper body clothing?: A Lot Help from another person to put on and taking off regular lower body clothing?: Total 6 Click Score: 11   End of Session Equipment Utilized During Treatment: Gait belt Nurse Communication: Mobility status;Need for lift equipment;Precautions  Activity Tolerance: Patient tolerated treatment well Patient left: in bed;with call bell/phone within reach;with bed alarm set;with family/visitor present;with SCD's reapplied  OT Visit Diagnosis: Unsteadiness on feet (R26.81);Other abnormalities of gait and mobility (R26.89);Muscle weakness (generalized) (M62.81);Pain                Time: 9381-8299 OT Time Calculation (min): 47 min Charges:  OT General Charges $OT Visit: 1 Visit OT Evaluation $OT Eval Moderate Complexity: 1 Mod OT Treatments $Self Care/Home Management : 8-22 mins   Brynn, OTR/L  Acute Rehabilitation Services Pager: [phone] Office: [phone] .   Jeri Modena 01/19/2021, 11:41 AM

## 2021-01-19 NOTE — Progress Notes (Signed)
   Providing Compassionate, Quality Care - Together  NEUROSURGERY PROGRESS NOTE   S: No issues overnight, states his numbness is slightly improved  O: EXAM:  BP 131/64 (BP Location: Right Arm)   Pulse (!) 56   Temp 98 F (36.7 C) (Oral)   Resp 18   Ht 5\' 11"  (1.803 m)   Wt 133.8 kg   SpO2 93%   BMI 41.14 kg/m   Awake, alert, oriented  Speech fluent, appropriate  PERRL 2/5 prox BLE 3/5 distal BLE Incision c/d/i  ASSESSMENT:  65 y.o. male with    1.  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  Please do not hesitate to call with questions or concerns.   Elwin Sleight, [facility] Cell: [phone]

## 2021-01-19 NOTE — Evaluation (Signed)
Physical Therapy Evaluation Patient Details Name: DEQUAVIUS KUHNER MRN: [id number] DOB: 08/30/1956 Today's Date: 01/19/2021   History of Present Illness  Pt is a 65 y.o. 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 PMH includes COPD, neuropathy, tremors, OA, anxiety, depression, HOH.    Clinical Impression  Pt presents with an overall decrease in functional mobility secondary to above. PTA, pt was ambulating household distance with RW and home therapy; pt also using w/c for mobility, with assist from wife for ADL tasks. Today, pt required maxA+2 to stand from elevated bed height, requiring complete blocking of bilateral knee instability. Pt limited by BLE weakness and sensation loss, decreased activity tolerance, impaired balance strategies/postural reactions and post-op back pain. Pt's wife present and supportive. Pt would benefit from intensive CIR-level therapies to maximize functional mobility and independence,as well as decrease caregiver burden, prior to return home; pt and wife in agreement. Will follow acutely to address established goals.     Follow Up Recommendations CIR;Supervision for mobility/OOB    Equipment Recommendations  W/c pressure relief pad; Power w/c with hand control   Recommendations for Other Services Rehab consult     Precautions / Restrictions Precautions Precautions: Fall Restrictions Weight Bearing Restrictions: No      Mobility  Bed Mobility Overal bed mobility: Needs Assistance Bed Mobility: Rolling;Supine to Sit;Sit to Supine Rolling: Max assist;+2 for physical assistance   Supine to sit: Mod assist;+2 for physical assistance Sit to supine: Max assist;+2 for physical assistance   General bed mobility comments: pt is able to move L LE toward R side of the bed with pad used to help pivot  toward EOB. pt using bed rail to help pull body toward R side of the bed with HOB elevated. pt with total +2 mod to progres to sitting. pt sitting min guard (A). Pt with a very wide sitting position with decreased hip adduction noted. pt noted to have edema at scrotum at this time. pt requires control of trunk to sit<>supine but following all cues total +2 max (A)    Transfers Overall transfer level: Needs assistance Equipment used: 2 person hand held assist Transfers: Sit to/from Stand Sit to Stand: Max assist;+2 physical assistance;From elevated surface         General transfer comment: Pt unable to stand with BUE support on RW; use of pad and gait belt with counting rocking momentum elevated bed surface to stand with bilateral knees blocked, BUE support on PT/OT, maxA+2 to elevate trunk, cue hip/trunk extension and block knees  Ambulation/Gait                Stairs            Wheelchair Mobility    Modified Rankin (Stroke Patients Only)       Balance Overall balance assessment: Needs assistance Sitting-balance support: Bilateral upper extremity supported;Feet supported;No upper extremity supported Sitting balance-Leahy Scale: Fair     Standing balance support: Bilateral upper extremity supported;During functional activity Standing balance-Leahy Scale: Zero Standing balance comment: Reliant on maxA+2 and BUE support to maintain standing balance                             Pertinent Vitals/Pain Pain Assessment: 0-10 Pain Score: 5  Pain Location: back Pain Descriptors / Indicators: Operative site guarding;Guarding  Pain Intervention(s): Monitored during session;Limited activity within patient's tolerance;Repositioned    Home Living Family/patient expects to be discharged to:: Private residence Living Arrangements: Spouse/significant other Available Help at Discharge: Family;Available 24 hours/day Type of Home: Mobile home Home Access: Ramped  entrance     Home Layout: One level Home Equipment: Wheelchair - manual;Other (comment);Hospital bed;Bedside commode;Shower seat;Hand held shower head;Grab bars - tub/shower;Grab bars - toilet;Walker - 2 wheels Additional Comments: son, 2 cats, 2 pitbulls, chicken/roosters    Prior Function Level of Independence: Needs assistance   Gait / Transfers Assistance Needed: using w/c since november; typically requiring hoyer lift for transfer to w/c; on "good days" can sit on side of bed and uses walker for short ambulation distance; sleeps in lift chair; son lifts pt into/out of car. Wife reports pt walked the length of their home last wednesday with RW and home therapist  ADL's / Homemaking Assistance Needed: able to self feed with weighted utensils; bed-level bath with wife, uses pads/depends for toileting  Comments: OT/PT have been seeing since 07/2020, each for 2x/wk (4 visits in a week) - bed sores had started to occur in nov due to prolonged positioning. Son helps with all lifting. wife has hx of back issues too     Hand Dominance   Dominant Hand: Right    Extremity/Trunk Assessment   Upper Extremity Assessment Upper Extremity Assessment: Generalized weakness;RUE deficits/detail;LUE deficits/detail RUE Deficits / Details: tremors RUE Coordination: decreased fine motor LUE Deficits / Details: tremors LUE Coordination: decreased fine motor    Lower Extremity Assessment Lower Extremity Assessment: RLE deficits/detail;LLE deficits/detail RLE Deficits / Details: Pt endorses sensation at knee and above, unable to feel light touch below knee; increased tone in BLEs; noted bilateral foot tremors (wife reports baseline, "His feet are always dancing"); ankle and knee with 3/5 strength, bilateral knee buckling with standing; hip strength functionally <3/5; BLEs resting with hip ER/ABD and associated muscle tightness with attempts at IR/ADD RLE Sensation: decreased light touch;decreased  proprioception RLE Coordination: decreased gross motor LLE Deficits / Details: Pt endorses sensation at knee and above, unable to feel light touch below knee; increased tone in BLEs; noted bilateral foot tremors (wife reports baseline, "His feet are always dancing"); ankle and knee with 3/5 strength, bilateral knee buckling with standing; hip strength functionally <3/5; BLEs resting with hip ER/ABD and associated muscle tightness with attempts at IR/ADD LLE Sensation: decreased light touch;decreased proprioception LLE Coordination: decreased gross motor    Cervical / Trunk Assessment Cervical / Trunk Assessment: Other exceptions Cervical / Trunk Exceptions: s/p T11-12 laminectomy  Communication   Communication: No difficulties  Cognition Arousal/Alertness: Awake/alert Behavior During Therapy: WFL for tasks assessed/performed Overall Cognitive Status: Within Functional Limits for tasks assessed                                        General Comments General comments (skin integrity, edema, etc.): Thoracic incision/dressing dry and intact; wife present and supportive. Pt requires frequent cues to breathe as he tends to hold breath with face becoming red; SpO2 93% on RA, HR 70    Exercises Other Exercises Other Exercises: positioned with linen at knees to help with adduction of knees toward midline for positioning to decrease "frog sit" positioning   Assessment/Plan    PT Assessment Patient needs continued PT services  PT Problem List Decreased strength;Decreased range of motion;Decreased activity tolerance;Decreased balance;Decreased mobility;Decreased coordination;Decreased  cognition;Decreased knowledge of precautions;Impaired sensation;Impaired tone;Obesity;Decreased skin integrity;Pain       PT Treatment Interventions DME instruction;Gait training;Functional mobility training;Therapeutic activities;Therapeutic exercise;Balance training;Neuromuscular  re-education;Patient/family education;Wheelchair mobility training    PT Goals (Current goals can be found in the Care Plan section)  Acute Rehab PT Goals Patient Stated Goal: to be able to walk PT Goal Formulation: With patient/family Time For Goal Achievement: 02/02/21 Potential to Achieve Goals: Good    Frequency Min 3X/week   Barriers to discharge        Co-evaluation PT/OT/SLP Co-Evaluation/Treatment: Yes Reason for Co-Treatment: Complexity of the patient's impairments (multi-system involvement);For patient/therapist safety;To address functional/ADL transfers PT goals addressed during session: Mobility/safety with mobility;Balance;Proper use of DME OT goals addressed during session: ADL's and self-care;Proper use of Adaptive equipment and DME;Strengthening/ROM       AM-PAC PT "6 Clicks" Mobility  Outcome Measure Help needed turning from your back to your side while in a flat bed without using bedrails?: Total Help needed moving from lying on your back to sitting on the side of a flat bed without using bedrails?: A Lot Help needed moving to and from a bed to a chair (including a wheelchair)?: Total Help needed standing up from a chair using your arms (e.g., wheelchair or bedside chair)?: Total Help needed to walk in hospital room?: Total Help needed climbing 3-5 steps with a railing? : Total 6 Click Score: 7    End of Session Equipment Utilized During Treatment: Gait belt Activity Tolerance: Patient tolerated treatment well Patient left: in bed;with call bell/phone within reach;with bed alarm set;with family/visitor present;with SCD's reapplied Nurse Communication: Mobility status;Need for lift equipment PT Visit Diagnosis: Other abnormalities of gait and mobility (R26.89);Other symptoms and signs involving the nervous system (R29.898);Muscle weakness (generalized) (M62.81)    Time: 3151-7616 PT Time Calculation (min) (ACUTE ONLY): 47 min   Charges:   PT  Evaluation $PT Eval Moderate Complexity: [place], PT, DPT Acute Rehabilitation Services  Pager [phone] Office [place] 01/19/2021, 1:15 PM

## 2021-01-19 NOTE — Consult Note (Signed)
Physical Medicine and Rehabilitation Consult  Reason for Consult: Functional deficits due to myelopathy.  Referring Physician: Dr. Reatha Armour.    HPI: Jimmy Le is a 65 y.o. male with history of COPD with DOE, MDD, ascending BLE numbness/tingling that started around 2005 with diagnosis of neuropathy which started worsening with numbness to midspine level. He started developing BLE weakness with falls and difficulty walking a year ago, BUE tremors with weakness and B/B incontinence since last fall. He was evaluated by Dr.Yan. 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 MRI spine showed T11-T12 bulging disc with moderate to severe spinal stenosis and cord compression as well as C6/C7 right foraminal stenosis. He was referred to Dr. Reatha Armour and elected to undergo T11-T12 laminectomy on 01/18/21.  Post op therapy evaluation reveals paraplegia with inability to stand and min guard assist to sit at EOB.  CIR recommended due to functional decline.    He has been receiving PT/OT since November 3-4 X week and had gotten strong enough to walk with +2 assist with therapy. He usually stays in bed w/wife was assisting with B&D. Son has had to lift him out of bed and comes on weekends to help with meals and home management.    Pt admits did stand x1 today, but took 2 people to get him to stand- feels weakness is limiting, but also limited by spasms of low back/spasticity.  C/o clenching/tight feeling of "hips" which he points to paraspinals.  Also leaning to R to get weight off L low back/buttocks.   Hasn't had a BM "lately".  Says was having bowel incontinence at home prior to surgery, but no BM since surgery.   Feels like holding hips in external rotation feels better on his back.  Feels like he's been peeing and emptying- doesn't feel like retaining urine.   Review of Systems  Constitutional: Negative for chills and fever.  HENT: Negative for  hearing loss and tinnitus.   Eyes: Negative for blurred vision and double vision.  Respiratory: Negative for cough and shortness of breath.   Cardiovascular: Negative for chest pain and palpitations.  Gastrointestinal: Positive for constipation. Negative for heartburn and nausea.       Has been incontinent since September.   Genitourinary: Positive for flank pain. Negative for dysuria and urgency.       Has been incontinent of bladder since September   Musculoskeletal: Positive for back pain and falls. Negative for myalgias.       Reports has not been walking since Oct/Nov. Son has been lifting him out of bed.   Skin: Negative for rash.  Neurological: Positive for sensory change, focal weakness and weakness. Negative for dizziness and headaches.  All other systems reviewed and are negative.    Past Medical History:  Diagnosis Date  . Anemia   . Anxiety and depression   . Arthritis   . Atherosclerosis of aorta ([place])   . COPD (chronic obstructive pulmonary disease) ([place])   . Dyspnea   . Emphysema lung ([place])   . GERD (gastroesophageal reflux disease)   . HOH (hard of hearing)   . Idiopathic peripheral neuropathy   . MRSA infection    H/O  . Neuropathy   . Osteoarthritis   . Paresis of lower extremity ([place])   . Tremors of nervous system   . Wheezing     Past Surgical History:  Procedure Laterality Date  . APPENDECTOMY  1966  . CATARACT EXTRACTION  W/PHACO Left 05/10/2017   Procedure: CATARACT EXTRACTION PHACO AND INTRAOCULAR LENS PLACEMENT (IOC);  Surgeon: Eulogio Bear, MD;  Location: [facility];  Service: Ophthalmology;  Laterality: Left;  [place] 00:24.4 AP% 7.0 CDE 1.72 FLUID PACK LOT # [id number] H  . CATARACT EXTRACTION W/PHACO Right 06/07/2017   Procedure: CATARACT EXTRACTION PHACO AND INTRAOCULAR LENS PLACEMENT (IOC);  Surgeon: Eulogio Bear, MD;  Location: [facility] OR[facility];  Service: Ophthalmology;  Laterality: Right;  [place] 00:22.6 AP% 9.2 CDE 2.07 FLUID PACK LOT # [id number] H  .  COLONOSCOPY    . EYE SURGERY    . FOOT SURGERY Bilateral 2010  . HERNIA REPAIR  1995  . LUMBAR LAMINECTOMY/DECOMPRESSION MICRODISCECTOMY N/A 01/18/2021   Procedure: Thoracic eleven-twelve Open Laminectomy;  Surgeon: Dawley, Theodoro Doing, DO;  Location: [facility];  Service: Neurosurgery;  Laterality: N/A;  . RCR Right 2010    Family History  Problem Relation Age of Onset  . Hepatitis Mother   . Hyperlipidemia Mother   . Hypertension Mother   . Prostate cancer Father   . Stroke Father   . COPD Brother   . Emphysema Brother   . Hypertension Brother   . Heart attack Brother   . Emphysema Maternal Grandmother   . Stroke Maternal Grandmother   . Hyperlipidemia Maternal Grandmother   . Hypertension Maternal Grandmother   . Heart attack Maternal Grandmother   . Hyperlipidemia Paternal Grandmother   . Arthritis Paternal Grandmother     Social History:  Married--wife disabled due to spinal stenosis. 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 He reports that he has been smoking--1 PPD. He has a 33.75 pack-year smoking history. He has never used smokeless tobacco. He reports that he does not drink alcohol and does not use drugs.    Allergies: No Known Allergies    Medications Prior to Admission  Medication Sig Dispense Refill  . ARIPiprazole (ABILIFY) 10 MG tablet Take 10 mg by mouth at bedtime.    . cholecalciferol (VITAMIN D) 25 MCG (1000 UNIT) tablet Take 1,000 Units by mouth in the morning.    . clonazePAM (KLONOPIN) 0.5 MG tablet Take 0.5 mg by mouth 2 (two) times daily.    Marland Kitchen escitalopram (LEXAPRO) 20 MG tablet Take 20 mg by mouth in the morning.    . ferrous sulfate 325 (65 FE) MG tablet Take 325 mg by mouth in the morning.    . pantoprazole (PROTONIX) 40 MG tablet Take 40 mg by mouth 2 (two) times daily.      Home: Home Living Family/patient expects to be discharged to:: Private residence Living Arrangements: Spouse/significant  other Available Help at Discharge: Family,Available 24 hours/day Type of Home: Mobile home Home Access: Ramped entrance [place]: One level Bathroom Shower/Tub: Multimedia programmer: Handicapped height Bathroom Accessibility: Yes Home Equipment: Wheelchair - Education administrator (comment),Hospital bed,Bedside commode,Shower seat,Hand held shower head,Grab bars - tub/shower,Grab bars - toilet,Walker - 2 wheels Additional Comments: son, 2 cats, 2 pitbulls, chicken/roosters  Functional History: Prior Function Level of Independence: Needs assistance Gait / Transfers Assistance Needed: using w/c since november; typically requiring hoyer lift for transfer to w/c; on "good days" can sit on side of bed and uses walker for short ambulation distance; sleeps in lift chair; son lifts pt into/out of car. Wife reports pt walked the length of their home last wednesday with RW and home therapist ADL's / Homemaking Assistance Needed: able to self feed with weighted utensils; bed-level bath with  wife, uses pads/depends for toileting Comments: OT/PT have been seeing since 07/2020, each for 2x/wk (4 visits in a week) - bed sores had started to occur in nov due to prolonged positioning. Son helps with all lifting. wife has hx of back issues too Functional Status:  Mobility: Bed Mobility Overal bed mobility: Needs Assistance Bed Mobility: Rolling,Supine to Sit,Sit to Supine Rolling: Max assist,+2 for physical assistance Supine to sit: Mod assist,+2 for physical assistance Sit to supine: Max assist,+2 for physical assistance General bed mobility comments: pt is able to move L LE toward R side of the bed with pad used to help pivot toward EOB. pt using bed rail to help pull body toward R side of the bed with HOB elevated. pt with total +2 mod to progres to sitting. pt sitting min guard (A). Pt with a very wide sitting position with decreased hip adduction noted. pt noted to have edema at scrotum at this time.  pt requires control of trunk to sit<>supine but following all cues total +2 max (A) Transfers Overall transfer level: Needs assistance Equipment used: 2 person hand held assist Transfers: Sit to/from Stand Sit to Stand: Max assist,+2 physical assistance,From elevated surface General transfer comment: Pt unable to stand with BUE support on RW; use of pad and gait belt with counting rocking momentum elevated bed surface to stand with bilateral knees blocked, BUE support on PT/OT, maxA+2 to elevate trunk, cue hip/trunk extension and block knees      ADL: ADL Overall ADL's : Needs assistance/impaired Eating/Feeding: Minimal assistance,Bed level Eating/Feeding Details (indicate cue type and reason): tremor noted with eating with normal utensils. pt has been using weighted at home Grooming: Wash/dry face,Set up,Bed level Upper Body Bathing: Moderate assistance,Bed level Lower Body Bathing: Total assistance,Bed level Toilet Transfer: Total assistance Toilet Transfer Details (indicate cue type and reason): bed level care requires due to inabiity to transfer at this time safely. Pt has 3n1 at home but has been using depends due to incontinence from spinal compression General ADL Comments: wife reports that care has become more bed level recently and that home therapist helped push for MRI that found compression.  Cognition: Cognition Overall Cognitive Status: Within Functional Limits for tasks assessed Orientation Level: Oriented X4 Cognition Arousal/Alertness: Awake/alert Behavior During Therapy: WFL for tasks assessed/performed Overall Cognitive Status: Within Functional Limits for tasks assessed  Blood pressure (!) 117/53, pulse 63, temperature 98.5 F (36.9 C), temperature source Oral, resp. rate 20, height 5\' 11"  (1.803 m), weight 133.8 kg, SpO2 96 %. Physical Exam Vitals and nursing note reviewed.  Constitutional:      Comments: Pt big gentleman- pulling self to R side and balancing  precariously on R hip/lateral aspect- by pulling on bedrails on R- reaching across himself to do so- hips externally rotated at rest- "more comfortable".  Laying in bed, NAD; doesn't speak a lot  HENT:     Head: Normocephalic and atraumatic.     Comments: Smile equal    Right Ear: External ear normal.     Left Ear: External ear normal.     Nose: Nose normal. No congestion.     Mouth/Throat:     Mouth: Mucous membranes are dry.     Pharynx: Oropharynx is clear. No oropharyngeal exudate.  Eyes:     General:        Left eye: No discharge.     Extraocular Movements: Extraocular movements intact.  Cardiovascular:     Rate and Rhythm: Normal rate and regular  rhythm.     Heart sounds: Normal heart sounds. No murmur heard. No gallop.   Pulmonary:     Comments: CTA B/L- no W/R/R- good air movement  Abdominal:     Comments: Soft, but distended- hyperactive- NT, but appears bloated  Musculoskeletal:     Cervical back: Normal range of motion. Tenderness present.     Comments: UEs- 5/5 B/L in biceps, triceps, WE, grip and finger abd.  RLE- HF 2/5, KE 3+/5, DF and PF 4+/5 LLE- HF 2-/5, KE 3-/5, DF and PF 4-/5 (feels both sides equal- doesn't examine equal)  Skin:    General: Skin is warm and dry.     Comments: IV L wrist- looks OK No skin breakdown noted on heels- couldn't assess incision or backside  Neurological:     Mental Status: He is oriented to person, place, and time.     Comments: Very decreased/absent sensation in LEs- to light touch B/L- a fe patchy areas sensation slightly better Tone- MAS of 1+ in hips/knees and ankles- no clonus B/L   Psychiatric:     Comments: Flat affect- but cordial     No results found for this or any previous visit (from the past 24 hour(s)). DG THORACOLUMABAR SPINE  Result Date: 01/18/2021 CLINICAL DATA:  Surgery, elective. Additional history provided: T11-T12 laminectomy. Provided fluoroscopy time 28 seconds (28.84 mGy). EXAM: THORACOLUMBAR SPINE  1V COMPARISON:  Thoracic spine MRI 01/06/2021. Lumbar spine MRI 12/07/2020. Chest CT 06/13/2019. FINDINGS: Six intraoperative fluoroscopic images of the thoracolumbar spine are submitted. On the initial AP intraoperative fluoroscopic image of the thoracic spine, a metallic site marker projects over the thoracic spine at the T11 level on the left. On the subsequent lateral view image of the lumbar spine, a metallic site marker projects posterior to the lumbar spine at the L3 level. On the two subsequent lateral view images of the lower thoracic spine, a metallic site marker projects posterior to the lower thoracic spine (although the exact spinal level is difficult to ascertain). On the subsequent lateral view fluoroscopic image of the lumbar spine, a metallic site marker projects posterior lumbar spine at the L3 level. On the subsequent lateral view radiograph of the thoracolumbar junction, a metallic site marker and retractors project posterior to what appears to be the T11-T12 level. IMPRESSION: Six intraoperative fluoroscopic images of the thoracolumbar spine for localization, as described. Electronically Signed   By: Kellie Simmering DO   On: 01/18/2021 14:28   DG C-Arm 1-60 Min  Result Date: 01/18/2021 CLINICAL DATA:  Surgery, elective. Additional history provided: T11-T12 laminectomy. Provided fluoroscopy time 28 seconds (28.84 mGy). EXAM: THORACOLUMBAR SPINE 1V COMPARISON:  Thoracic spine MRI 01/06/2021. Lumbar spine MRI 12/07/2020. Chest CT 06/13/2019. FINDINGS: Six intraoperative fluoroscopic images of the thoracolumbar spine are submitted. On the initial AP intraoperative fluoroscopic image of the thoracic spine, a metallic site marker projects over the thoracic spine at the T11 level on the left. On the subsequent lateral view image of the lumbar spine, a metallic site marker projects posterior to the lumbar spine at the L3 level. On the two subsequent lateral view images of the lower thoracic spine, a  metallic site marker projects posterior to the lower thoracic spine (although the exact spinal level is difficult to ascertain). On the subsequent lateral view fluoroscopic image of the lumbar spine, a metallic site marker projects posterior lumbar spine at the L3 level. On the subsequent lateral view radiograph of the thoracolumbar junction, a metallic site marker and  retractors project posterior to what appears to be the T11-T12 level. IMPRESSION: Six intraoperative fluoroscopic images of the thoracolumbar spine for localization, as described. Electronically Signed   By: Kellie Simmering DO   On: 01/18/2021 14:28     Assessment/Plan: Diagnosis: Paraplegia with neurogenic bowel- possible neurogenic bladder 1. Does the need for close, 24 hr/day medical supervision in concert with the patient's rehab needs make it unreasonable for this patient to be served in a less intensive setting? Potentially 2. Co-Morbidities requiring supervision/potential complications: Neurogenic bowel/bladder, SCI, COPD, depression, tremors 3. Due to bladder management, bowel management, safety, skin/wound care, disease management, medication administration, pain management and patient education, does the patient require 24 hr/day rehab nursing? Potentially 4. Does the patient require coordinated care of a physician, rehab nurse, therapy disciplines of PT, OT to address physical and functional deficits in the context of the above medical diagnosis(es)? Potentially Addressing deficits in the following areas: balance, endurance, locomotion, strength, transferring, bowel/bladder control, bathing, dressing, feeding, grooming and toileting 5. Can the patient actively participate in an intensive therapy program of at least 3 hrs of therapy per day at least 5 days per week? Potentially 6. The potential for patient to make measurable gains while on inpatient rehab is fair 7. Anticipated functional outcomes upon discharge from inpatient  rehab are min assist and mod assist  with PT, min assist and mod assist with OT, n/a with SLP. 8. Estimated rehab length of stay to reach the above functional goals is: ~ 3 weeks 9. Anticipated discharge destination: depends on pt decision 10. Overall Rehab/Functional Prognosis: fair  RECOMMENDATIONS: This patient's condition is appropriate for continued rehabilitative care in the following setting: depends on pt decision- CIR vs SNF Patient has agreed to participate in recommended program. Potentially Note that insurance prior authorization may be required for reimbursement for recommended care.  Comment:  1. Pt is a POSSIBLE candidate for inpt rehab- explained to pt, likely would only get 3 weeks or so and HAS to do 3 hours/day- I asked him to think about it, since SNF/subacute rehab would likely be 1-1.5 hours/day and 6-8 weeks- and coming to CIR would negate his insurance/ability to go to SNF afterwards- because of new insurance rules.  2. He HAS to do one or the other- cannot do both- was very clear with pt about this.  3. Pt's muscle spasms are SPASTICITY, not just run of the mills spasms- suggest Baclofen 5 mg QID initially- but in 2 days, can increase to 10 mg TID-QID- can also give more at night since spasms usually worse at night. If too sedated on Baclofen, can always try Zanaflex 2-4 mg BID- however all spasticity can cause sedation.  4. Pt needs to be cleaned out- with Mg citrate then Fleets enema, if he hasn't had BM, like he reported- per pt, been >1 week, maybe since surgery or before- has neurogenic bowel- will need PO senna/miralax daily-BID and might need dulcolax suppository added nightly right after dinner- if goes to SNF, they won't do digital stimulation, so might as well not add it. 5. I explained to pt, I'm happy to be his SCI physician, whether he goes to CIR or SNF- if goes to SNF, please refer to our clinic for me to see him in ~ 6 weeks- in f/u and I will take care of SCI  issues.  6. Please bladder scan pt to make sure he's emptying- if has neurogenic bowel, likely has neurogenic bladder as well- thank you.  7. Feel free to call me with questions- [phone]- however pt is NOT sure he wants to do CIR- he's going to think about it.  8. Thank you for this consult.      Bary Leriche, PA-C 01/19/2021    I have personally performed a face to face diagnostic evaluation of this patient and formulated the key components of the plan.  Additionally, I have personally reviewed laboratory data, imaging studies, as well as relevant notes and concur with the physician assistant's documentation above.

## 2021-01-20 MED ORDER — BACLOFEN 10 MG PO TABS
10.0000 mg | ORAL_TABLET | Freq: Three times a day (TID) | ORAL | Status: DC
  Administered 2021-01-20 – 2021-01-24 (×12): 10 mg via ORAL
  Filled 2021-01-20 (×12): qty 1

## 2021-01-20 NOTE — Anesthesia Postprocedure Evaluation (Signed)
Anesthesia Post Note  Patient: Jimmy Le  Procedure(s) Performed: Thoracic eleven-twelve Open Laminectomy (N/A )     Patient location during evaluation: PACU Anesthesia Type: General Level of consciousness: awake and alert Pain management: pain level controlled Vital Signs Assessment: post-procedure vital signs reviewed and stable Respiratory status: spontaneous breathing, nonlabored ventilation, respiratory function stable and patient connected to nasal cannula oxygen Cardiovascular status: blood pressure returned to baseline and stable Postop Assessment: no apparent nausea or vomiting Anesthetic complications: no   No complications documented.  Last Vitals:  Vitals:   01/20/21 0353 01/20/21 0422  BP: (!) 214/89 (!) 175/84  Pulse: 88 62  Resp: 17 20  Temp: 36.9 C 37.6 C  SpO2: 93% 94%    Last Pain:  Vitals:   01/20/21 0500  TempSrc:   PainSc: [place]

## 2021-01-20 NOTE — Progress Notes (Signed)
   Providing Compassionate, Quality Care - Together  NEUROSURGERY PROGRESS NOTE   S: No issues overnight.  Positive flatus, no BM, states his numbness/tingling is continued to improve.  Also feels as though his strength is slightly improved in his legs  O: EXAM:  BP 133/71 (BP Location: Right Arm)   Pulse 62   Temp 98.7 F (37.1 C) (Oral)   Resp 18   Ht 5\' 11"  (1.803 m)   Wt 133.8 kg   SpO2 94%   BMI 41.14 kg/m   Awake, alert, oriented  Speech fluent, appropriate  PERRL 2/5 prox BLE 3/5 distal BLE Incision c/d/i  ASSESSMENT:  65 y.o. 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  Please do not hesitate to call with questions or concerns.   Elwin Sleight, [facility] Cell: [phone]

## 2021-01-20 NOTE — Progress Notes (Signed)
Occupational Therapy Treatment Patient Details Name: Jimmy Le MRN: [id number] DOB: 1956-04-28 Today's Date: 01/20/2021    History of present illness 65 y.o. 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 PMH includes COPD, neuropathy, tremors, OA, anxiety, depression, HOH.   OT comments  Pt progressing towards established OT goals and presents with high motivation. Pt performing bed mobility with Mod-Max A +2 using log roll technique. Pt performing sit<>stand with Mod-Max A +2 and stedy; x3. Pt requiring Total A for peri care after bowel incontinence. Pt continues to present with poor balance, strength, and activity tolerance. Continue to recommend dc to CIR and will continue to follow acutely as admitted.    Follow Up Recommendations  CIR    Equipment Recommendations  Other (comment) (power wheelchair with hand control)    Recommendations for Other Services Rehab consult    Precautions / Restrictions Precautions Precautions: Fall;Other (comment) Precaution Comments: Bladder/bowel incontinence Restrictions Weight Bearing Restrictions: No       Mobility Bed Mobility Overal bed mobility: Needs Assistance Bed Mobility: Rolling;Sidelying to Sit;Sit to Sidelying Rolling: Mod assist;+2 for physical assistance Sidelying to sit: Mod assist;+2 for safety/equipment;+2 for physical assistance Supine to sit: Mod assist;+2 for physical assistance Sit to supine: Max assist;+2 for physical assistance Sit to sidelying: Max assist;+2 for physical assistance;+2 for safety/equipment General bed mobility comments: Cues for log roll technique; modA+2 to roll with cues to use UE support on hand rail, modA+2 for BLE management and trunk elevation; maxA+2 for return to supine, pt limited by back pain and significant bilateral hip  tightness; frequent cues for breathing as pt tends to hold breath with tough mobility    Transfers Overall transfer level: Needs assistance Equipment used: Ambulation equipment used Transfers: Sit to/from Stand Sit to Stand: Max assist;Mod assist;+2 physical assistance         General transfer comment: MaxA+2 for initial stand from low bed height to stedy frame, pt able to assist with BUE support on stedy frame; multiple additional sit<>stands from stedy seat with modA+2; verbal/tactile cues for trunk/hip extension    Balance Overall balance assessment: Needs assistance Sitting-balance support: Bilateral upper extremity supported;Feet supported;No upper extremity supported Sitting balance-Leahy Scale: Fair     Standing balance support: Bilateral upper extremity supported;During functional activity Standing balance-Leahy Scale: Zero Standing balance comment: Reliant on BUE support and external assist to maintain static standing in stedy frame with bilateral knees blocked                           ADL either performed or assessed with clinical judgement   ADL Overall ADL's : Needs assistance/impaired                         Toilet Transfer: Maximal assistance;+2 for physical assistance;+2 for safety/equipment;Moderate assistance (sit<>stand with stedy) Toilet Transfer Details (indicate cue type and reason): Max A +2 to power up from EOB and then Mod A +2 for stedy seat Toileting- Clothing Manipulation and Hygiene: Total assistance;+2 for physical assistance;Sit to/from stand Toileting - Clothing Manipulation Details (indicate cue type and reason): Total A for peri care after incontinence     Functional mobility during ADLs: Moderate assistance;Maximal assistance;+2 for physical assistance (sit<>stand with stedy) General ADL Comments: Pt highly motivated to perform sit<>stand with  stedy.     Vision       Perception     Praxis      Cognition  Arousal/Alertness: Awake/alert Behavior During Therapy: WFL for tasks assessed/performed Overall Cognitive Status: Within Functional Limits for tasks assessed                                 General Comments: Very motivated        Exercises Exercises: Other exercises Other Exercises Other Exercises: BLEs positioned to encourage more neutral hip rotation, including add/IR (pt rests with bilateral hip ABD/ER); heels floated to reduce risk for pressure wound   Shoulder Instructions       General Comments Thoracic incision/dressing dry and intact. Pt requires cues to breathe as he tends to hold breath with face becoming red    Pertinent Vitals/ Pain       Pain Assessment: Faces Faces Pain Scale: Hurts little more Pain Location: Bilateral hips, back Pain Descriptors / Indicators: Operative site guarding;Guarding;Grimacing Pain Intervention(s): Monitored during session;Limited activity within patient's tolerance;Repositioned  Home Living                                          Prior Functioning/Environment              Frequency  Min 2X/week        Progress Toward Goals  OT Goals(current goals can now be found in the care plan section)  Progress towards OT goals: Progressing toward goals  Acute Rehab OT Goals Patient Stated Goal: to be able to walk OT Goal Formulation: With patient/family Time For Goal Achievement: 02/02/21 Potential to Achieve Goals: Good ADL Goals Pt Will Perform Eating: with modified independence;with adaptive utensils;bed level Pt Will Perform Upper Body Bathing: with min assist;sitting Additional ADL Goal #1: pt will complete bed mobility mod (A) as precusror to adls. Additional ADL Goal #2: pt will complete sit<>Stand total +2 min (A) from elevate surface without LE blocking  Plan Discharge plan remains appropriate    Co-evaluation    PT/OT/SLP Co-Evaluation/Treatment: Yes Reason for Co-Treatment: For  patient/therapist safety;To address functional/ADL transfers PT goals addressed during session: Mobility/safety with mobility;Balance;Proper use of DME OT goals addressed during session: ADL's and self-care      AM-PAC OT "6 Clicks" Daily Activity     Outcome Measure   Help from another person eating meals?: A Little Help from another person taking care of personal grooming?: A Little Help from another person toileting, which includes using toliet, bedpan, or urinal?: Total Help from another person bathing (including washing, rinsing, drying)?: Total Help from another person to put on and taking off regular upper body clothing?: A Lot Help from another person to put on and taking off regular lower body clothing?: Total 6 Click Score: 11    End of Session Equipment Utilized During Treatment: Gait belt  OT Visit Diagnosis: Unsteadiness on feet (R26.81);Other abnormalities of gait and mobility (R26.89);Muscle weakness (generalized) (M62.81);Pain   Activity Tolerance Patient tolerated treatment well   Patient Left in bed;with call bell/phone within reach;with bed alarm set;with SCD's reapplied   Nurse Communication Mobility status;Need for lift equipment;Precautions        Time: 1130-1201 OT Time Calculation (min): 31 min  Charges: OT General Charges $OT Visit: 1 Visit OT Treatments $Self Care/Home Management :  8-22 mins  [place], OTR/L Acute Rehab Pager: [phone] Office: [place] 01/20/2021, 2:46 PM

## 2021-01-20 NOTE — Progress Notes (Signed)
Rehab MD wanted to place pt on TID Baclofen. RN notified Neurosurgery. MD placed verbal order.

## 2021-01-20 NOTE — Progress Notes (Signed)
Orthopedic Tech Progress Note Patient Details:  Jimmy Le 1956/05/11 [id number]  Ortho Devices Type of Ortho Device: Prafo boot/shoe Ortho Device/Splint Location: Bilateral Ortho Device/Splint Interventions: Ordered,Application   Post Interventions Patient Tolerated: Well   Chip Boer 01/20/2021, 3:44 PM

## 2021-01-20 NOTE — Progress Notes (Signed)
Physical Therapy Treatment Patient Details Name: Jimmy Le MRN: [id number] DOB: [birth year] Today's Date: 01/20/2021    History of Present Illness Pt is a 65 y.o. 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 PMH includes COPD, neuropathy, tremors, OA, anxiety, depression, HOH.   PT Comments    Pt progressing with mobility. Tolerated multiple standing trials in stedy standing frame with mod-maxA+2; pt demonstrates bilateral knee instability and significant bilateral hip tightness. Pt unaware of bowel/bladder incontinence, dependent for pericare/hygiene. Pt remains limited by diffuse weakness, decreased sensation (noted improvements in bilateral lower leg light touch sensation, although still absent in feet), impaired balance, pain; pt highly motivated to participate and regain PLOF (was able to ambulate short distance with RW and assist from [facility] therapies). Continue to recommend intensive CIR-level therapies to maximize functional mobility and independence, as well as decrease caregiver burden. Will follow acutely to address established goals.    Follow Up Recommendations  CIR;Supervision for mobility/OOB     Equipment Recommendations  Power w/c with hand control, pressure relief w/c cushion   Recommendations for Other Services       Precautions / Restrictions Precautions Precautions: Fall;Other (comment) Precaution Comments: Bladder/bowel incontinence Restrictions Weight Bearing Restrictions: No    Mobility  Bed Mobility Overal bed mobility: Needs Assistance Bed Mobility: Rolling;Supine to Sit;Sit to Supine Rolling: Mod assist;+2 for physical assistance   Supine to sit: Mod assist;+2 for physical assistance Sit to supine: Max assist;+2 for physical assistance   General bed mobility comments: Cues for log roll  technique; modA+2 to roll with cues to use UE support on hand rail, modA+2 for BLE management and trunk elevation; maxA+2 for return to supine, pt limited by back pain and significant bilateral hip tightness; frequent cues for breathing as pt tends to hold breath with tough mobility    Transfers Overall transfer level: Needs assistance Equipment used: Ambulation equipment used Transfers: Sit to/from Stand Sit to Stand: Max assist;Mod assist;+2 physical assistance         General transfer comment: MaxA+2 for initial stand from low bed height to stedy frame, pt able to assist with BUE support on stedy frame; multiple additional sit<>stands from stedy seat with modA+2; verbal/tactile cues for trunk/hip extension  Ambulation/Gait                 Stairs             Wheelchair Mobility    Modified Rankin (Stroke Patients Only)       Balance Overall balance assessment: Needs assistance Sitting-balance support: Bilateral upper extremity supported;Feet supported;No upper extremity supported Sitting balance-Leahy Scale: Fair     Standing balance support: Bilateral upper extremity supported;During functional activity Standing balance-Leahy Scale: Zero Standing balance comment: Reliant on BUE support and external assist to maintain static standing in stedy frame with bilateral knees blocked                            Cognition Arousal/Alertness: Awake/alert Behavior During Therapy: WFL for tasks assessed/performed Overall Cognitive Status: Within Functional Limits for tasks assessed                                        Exercises Other Exercises  Other Exercises: BLEs positioned to encourage more neutral hip rotation, including add/IR (pt rests with bilateral hip ABD/ER); heels floated to reduce risk for pressure wound    General Comments General comments (skin integrity, edema, etc.): Thoracic incision/dressing dry and intact. Pt requires  cues to breathe as he tends to hold breath with face becoming red      Pertinent Vitals/Pain Pain Assessment: Faces Faces Pain Scale: Hurts little more Pain Location: Bilateral hips, back Pain Descriptors / Indicators: Operative site guarding;Guarding;Grimacing Pain Intervention(s): Monitored during session;Repositioned    Home Living                      Prior Function            PT Goals (current goals can now be found in the care plan section) Progress towards PT goals: Progressing toward goals    Frequency    Min 3X/week      PT Plan Current plan remains appropriate    Co-evaluation PT/OT/SLP Co-Evaluation/Treatment: Yes Reason for Co-Treatment: Complexity of the patient's impairments (multi-system involvement);For patient/therapist safety;To address functional/ADL transfers PT goals addressed during session: Mobility/safety with mobility;Balance;Proper use of DME        AM-PAC PT "6 Clicks" Mobility   Outcome Measure  Help needed turning from your back to your side while in a flat bed without using bedrails?: Total Help needed moving from lying on your back to sitting on the side of a flat bed without using bedrails?: A Lot Help needed moving to and from a bed to a chair (including a wheelchair)?: Total Help needed standing up from a chair using your arms (e.g., wheelchair or bedside chair)?: Total Help needed to walk in hospital room?: Total Help needed climbing 3-5 steps with a railing? : Total 6 Click Score: 7    End of Session Equipment Utilized During Treatment: Gait belt Activity Tolerance: Patient tolerated treatment well Patient left: in bed;with call bell/phone within reach;with bed alarm set;with SCD's reapplied Nurse Communication: Mobility status;Need for lift equipment PT Visit Diagnosis: Other abnormalities of gait and mobility (R26.89);Other symptoms and signs involving the nervous system (R29.898);Muscle weakness (generalized)  (M62.81)     Time: 1610-9604 PT Time Calculation (min) (ACUTE ONLY): 29 min  Charges:  $Therapeutic Activity: 8-22 mins                     Mabeline Caras, PT, DPT Acute Rehabilitation Services  Pager [phone] Office [place] 01/20/2021, 1:34 PM

## 2021-01-21 NOTE — TOC Initial Note (Addendum)
Transition of Care [facility]) - Initial/Assessment Note    Patient Details  Name: Jimmy Le MRN: [id number] Date of Birth: 04/11/1956  Transition [facility]) CM/SW Contact:    Geralynn Ochs, LCSW Phone Number: 01/21/2021, 11:26 AM  Clinical Narrative:    CSW met with patient to discuss possible interest in SNF, as patient is not sure that he would want to do CIR at this time. Patient with delayed responses during discussion, and said he didn't know what to do. CSW explained SNF, and patient said he is familiar because his dad was in one in [place] after he had a stroke. Patient gave permission for CSW to fax out referral and follow up with some information for him and his wife to look over on options available. CSW to follow.  Patient has been vaccinated and boosted.       UPDATE 3:12 PM: CSW met with patient to provide bed offers. Per patient, his wife will be coming up tonight or tomorrow, and he will discuss it with her to help make a choice. CSW will have coverage check in with him over the weekend on choice and start insurance authorization request.           Expected Discharge Plan: IP Rehab Facility Barriers to Discharge: Continued Medical Work up,Insurance Authorization   Patient Goals and CMS Choice Patient states their goals for this hospitalization and ongoing recovery are:: to get rehab, but still unsure where CMS Medicare.gov Compare Post Acute Care list provided to:: Patient Choice offered to / list presented to : Patient  Expected Discharge Plan and Services Expected Discharge Plan: [place]     Post Acute Care Choice: NA Living arrangements for the past 2 months: Single Family Home                                      Prior Living Arrangements/Services Living arrangements for the past 2 months: Single Family Home Lives with:: Spouse Patient language and need for interpreter reviewed:: No Do you feel safe going back to the place where you  live?: Yes      Need for Family Participation in Patient Care: No (Comment) Care giver support system in place?: Yes (comment) Current home services: DME Criminal Activity/Legal Involvement Pertinent to Current Situation/Hospitalization: No - Comment as needed  Activities of Daily Living Home Assistive Devices/Equipment: Wheelchair,Walker (specify type),Eyeglasses,Shower chair with back,Raised toilet seat with rails,Grab bars in shower,Grab bars around toilet,Hand-held shower hose ADL Screening (condition at time of admission) Patient's cognitive ability adequate to safely complete daily activities?: Yes Is the patient deaf or have difficulty hearing?: No Does the patient have difficulty seeing, even when wearing glasses/contacts?: No Does the patient have difficulty concentrating, remembering, or making decisions?: No Patient able to express need for assistance with ADLs?: Yes Does the patient have difficulty dressing or bathing?: Yes Independently performs ADLs?: No Communication: Independent Dressing (OT): Needs assistance Is this a change from baseline?: Pre-admission baseline Grooming: Needs assistance Is this a change from baseline?: Pre-admission baseline Feeding: Independent Bathing: Needs assistance Is this a change from baseline?: Pre-admission baseline Toileting: Needs assistance Is this a change from baseline?: Pre-admission baseline In/Out Bed: Needs assistance Is this a change from baseline?: Pre-admission baseline Walks in Home: Independent with device (comment),Needs assistance Is this a change from baseline?: Pre-admission baseline Does the patient have difficulty walking or climbing stairs?: Yes Weakness  of Legs: Both Weakness of Arms/Hands: None  Permission Sought/Granted Permission sought to share information with : Facility Retail banker granted to share information with : Yes, Verbal Permission Granted  Share Information  with NAME: Randell Loop  Permission granted to share info w AGENCY: SNF  Permission granted to share info w Relationship: Wife     Emotional Assessment Appearance:: Appears stated age Attitude/Demeanor/Rapport: Engaged Affect (typically observed): Appropriate Orientation: : Oriented to Self,Oriented to Place,Oriented to  Time,Oriented to Situation Alcohol / Substance Use: Not Applicable Psych Involvement: No (comment)  Admission diagnosis:  Thoracic spondylosis with myelopathy [M47.14] Patient Active Problem List   Diagnosis Date Noted  . Thoracic spondylosis with myelopathy 01/18/2021  . 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 Neuropathy 11/23/2020  . Incontinence of feces 11/22/2020  . Gait abnormality 11/11/2020  . Paresthesia 11/11/2020  . Weakness 11/11/2020   PCP:  Philmore Pali, NP Pharmacy:   [id number] [id number]: [phone]: [phone]    Social Determinants of Health (SDOH) Interventions    Readmission Risk Interventions No flowsheet data found.

## 2021-01-21 NOTE — Progress Notes (Signed)
Physical Therapy Treatment Patient Details Name: Jimmy Le MRN: [id number] DOB: [birth year] Today's Date: 01/21/2021    History of Present Illness Pt is a 65 y.o. 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 PMH includes COPD, neuropathy, tremors, OA, anxiety, depression, HOH.   PT Comments    Pt progressing with mobility, limited by c/o increased bilateral hip and back pain this session (pre-medicated). Pt tolerated prolonged seated edge of bed activity with and without UE support. Pt unaware of bowel incontinence, demonstrates improved ability to roll R/L despite increased pain, dependent for pericare/hygiene. PRAFOs repositioned to encourage BLE IR/ADD and ankle DF. Noted pt requesting SNF-level therapies instead of CIR; will update d/c recommendations and continue to follow acutely.   Follow Up Recommendations  SNF;Supervision for mobility/OOB     Equipment Recommendations  Power w/c with hand control; Pressure relief cushion   Recommendations for Other Services       Precautions / Restrictions Precautions Precautions: Back;Fall;Other (comment) Precaution Booklet Issued: No Precaution Comments: Bladder/bowel incontinence; bilateral PRAFOs Restrictions Weight Bearing Restrictions: No    Mobility  Bed Mobility Overal bed mobility: Needs Assistance Bed Mobility: Rolling;Sidelying to Sit;Sit to Sidelying Rolling: Max assist;+2 for safety/equipment Sidelying to sit: Max assist     Sit to sidelying: Max assist General bed mobility comments: Cues for log roll technique; maxA+2 from sidelying<>sit, pt using UE support on rail without cues this session; pt unaware of bowel incontinence upon return to supine, maxA to roll R/L, +2 assist for hygiene/pericare and linen change    Transfers                  General transfer comment: Deferred secondary to c/o significant bilateral hip and back pain while seated EOB, as well as bowel incontinence  Ambulation/Gait                 Stairs             Wheelchair Mobility    Modified Rankin (Stroke Patients Only)       Balance Overall balance assessment: Needs assistance Sitting-balance support: Bilateral upper extremity supported;Feet supported;No upper extremity supported Sitting balance-Leahy Scale: Fair Sitting balance - Comments: Tolerated prolonged sitting EOB activity; requires UE support with BLE stretch resulting in increased posterior lean (suspect from tight hip/glut/back musculature)                                    Cognition Arousal/Alertness: Awake/alert Behavior During Therapy: WFL for tasks assessed/performed Overall Cognitive Status: Within Functional Limits for tasks assessed                                 General Comments: WFL for simple tasks; at times requiring increased time to answer questions/process info      Exercises Other Exercises Other Exercises: Seated EOB: bilateral hamstring stretch (AAROM), LAQ (AROM, require assist to achieve full knee extension), ankle pumps; hip IR/ADD (AAROM)    General Comments        Pertinent Vitals/Pain Pain Assessment: Faces Faces Pain Scale: Hurts whole lot Pain Location: Bilateral hips, back Pain Descriptors / Indicators: Operative site guarding;Guarding;Grimacing Pain Intervention(s): Monitored during session;Limited activity within patient's tolerance;Repositioned    Home  Living                      Prior Function            PT Goals (current goals can now be found in the care plan section) Progress towards PT goals: Progressing toward goals    Frequency    Min 3X/week      PT Plan Discharge plan needs to be updated    Co-evaluation              AM-PAC PT "6 Clicks" Mobility   Outcome  Measure  Help needed turning from your back to your side while in a flat bed without using bedrails?: A Lot Help needed moving from lying on your back to sitting on the side of a flat bed without using bedrails?: A Lot Help needed moving to and from a bed to a chair (including a wheelchair)?: Total Help needed standing up from a chair using your arms (e.g., wheelchair or bedside chair)?: Total Help needed to walk in hospital room?: Total Help needed climbing 3-5 steps with a railing? : Total 6 Click Score: 8    End of Session   Activity Tolerance: Patient tolerated treatment well;Patient limited by pain Patient left: in bed;with call bell/phone within reach;with bed alarm set;with SCD's reapplied Nurse Communication: Mobility status;Need for lift equipment;Other (comment) (pt having a second BM, NT present and aware, to return for hygiene when pt finished) PT Visit Diagnosis: Other abnormalities of gait and mobility (R26.89);Other symptoms and signs involving the nervous system (R29.898);Muscle weakness (generalized) (M62.81)     Time: 4401-0272 PT Time Calculation (min) (ACUTE ONLY): 26 min  Charges:  $Therapeutic Exercise: 8-22 mins $Therapeutic Activity: 8-22 mins                     Mabeline Caras, PT, DPT Acute Rehabilitation Services  Pager [phone] Office [place] 01/21/2021, 5:16 PM

## 2021-01-21 NOTE — Progress Notes (Signed)
Inpatient Rehab Admissions Coordinator:   Met with patient at bedside to further discuss CIR versus SNF.  At this time pt feels he would prefer SNF.  He did not want me to attempt insurance auth for CIR at this time.  I let Rexene Alberts, LCSW know and we will sign off for CIR.    Shann Medal, PT, DPT Admissions Coordinator [phone] 01/21/21  11:58 AM

## 2021-01-21 NOTE — Plan of Care (Signed)

## 2021-01-21 NOTE — Progress Notes (Signed)
Physical Therapy Treatment Patient Details Name: Jimmy Le MRN: [id number] DOB: Jan 11, 1956 Today's Date: 01/21/2021    History of Present Illness Pt is a 65 y.o. 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 PMH includes COPD, neuropathy, tremors, OA, anxiety, depression, HOH.   PT Comments    Pt seen for additional session. Pt noted to be in significant discomfort with c/o bilateral hip pain. Pt twisting trunk R/L with use of bed rail in attempts to alleviate pain. Reviewed educ and importance of post-op back precautions (including "no twisting") but pt reports he plans to continue this because hip pain too great. Unsuccessful attempts to reposition pt in partial sidelying with pillow support, still not comfort. Trialled PRAFOs, pt's BLEs and movement to restless for them to stay in position. RN aware pt requesting pain medications. Music left playing in hopes to distract pt some from pain. Will continue to follow acutely.     Follow Up Recommendations  SNF;Supervision for mobility/OOB     Equipment Recommendations  Other (comment) (power w/c with hand control, pressure relief cushion)    Recommendations for Other Services       Precautions / Restrictions Precautions Precautions: Back;Fall;Other (comment) Precaution Booklet Issued: Yes (comment) Precaution Comments: Bladder/bowel incontinence; bilateral PRAFOs; pt unable to recall back precautions from yesterday's session, reviewed and handout provided (pt with little to no teachback, very distracted by pain) Restrictions Weight Bearing Restrictions: No    Mobility  Bed Mobility Overal bed mobility: Needs Assistance Bed Mobility: Rolling Rolling: Max assist;+2 for safety/equipment Sidelying to sit: Max assist     Sit to sidelying: Max assist General bed mobility  comments: Pt attempting to roll trunk R/L multiple times to alleviate back and hip pain, BUE support on bed rolls; pt noted to be twisting back with this, max education on back precautions, but pt still opting to continue this in hopes to alleviate hip pain; multiple attempts to assist pt in repositioning to keep back straight with pillow support in partial sidelying, but pt ultimately wanting to change position due to discomfort    Transfers                 General transfer comment: Deferred secondary to c/o significant bilateral hip and back pain while seated EOB, as well as bowel incontinence  Ambulation/Gait                 Stairs             Wheelchair Mobility    Modified Rankin (Stroke Patients Only)       Balance Overall balance assessment: Needs assistance Sitting-balance support: Bilateral upper extremity supported;Feet supported;No upper extremity supported Sitting balance-Leahy Scale: Fair Sitting balance - Comments: Tolerated prolonged sitting EOB activity; requires UE support with BLE stretch resulting in increased posterior lean (suspect from tight hip/glut/back musculature)                                    Cognition Arousal/Alertness: Awake/alert Behavior During Therapy: WFL for tasks assessed/performed Overall Cognitive Status: Within Functional Limits for tasks assessed                                 General  Comments: WFL for simple tasks; at times requiring increased time to answer questions/process info      Exercises Other Exercises Other Exercises: Seated EOB: bilateral hamstring stretch (AAROM), LAQ (AROM, require assist to achieve full knee extension), ankle pumps; hip IR/ADD (AAROM)    General Comments General comments (skin integrity, edema, etc.): Bilateral PRAFOs repositioned, but ultimately becoming dislodged as pt rolling and moving BLEs, removed for now; hips positioned with towels/pads to  encourage IR/ADD, difficult to maintain with pt's repositioning/restlessness due to pain. IV noted to be disloged upon entering room (pt reports nursing aware), bleeding stop when pressure applied. RN notified pt requesting more pain medicine      Pertinent Vitals/Pain Pain Assessment: Faces Faces Pain Scale: Hurts whole lot Pain Location: Bilateral hips, back Pain Descriptors / Indicators: Operative site guarding;Guarding;Grimacing;Moaning Pain Intervention(s): Monitored during session;Repositioned;Patient requesting pain meds-RN notified;Relaxation (played music for distraction)    Home Living                      Prior Function            PT Goals (current goals can now be found in the care plan section) Progress towards PT goals: Not progressing toward goals - comment (limited by pain)    Frequency    Min 3X/week      PT Plan Current plan remains appropriate    Co-evaluation              AM-PAC PT "6 Clicks" Mobility   Outcome Measure  Help needed turning from your back to your side while in a flat bed without using bedrails?: A Lot Help needed moving from lying on your back to sitting on the side of a flat bed without using bedrails?: A Lot Help needed moving to and from a bed to a chair (including a wheelchair)?: Total Help needed standing up from a chair using your arms (e.g., wheelchair or bedside chair)?: Total Help needed to walk in hospital room?: Total Help needed climbing 3-5 steps with a railing? : Total 6 Click Score: 8    End of Session   Activity Tolerance: Patient limited by pain Patient left: in bed;with call bell/phone within reach;with bed alarm set;with SCD's reapplied Nurse Communication: Mobility status;Need for lift equipment PT Visit Diagnosis: Other abnormalities of gait and mobility (R26.89);Other symptoms and signs involving the nervous system (R29.898);Muscle weakness (generalized) (M62.81)     Time: 0981-1914 PT Time  Calculation (min) (ACUTE ONLY): 15 min  Charges:  $Therapeutic Activity: 8-22 mins                     Mabeline Caras, PT, DPT Acute Rehabilitation Services  Pager [phone] Office [place] 01/21/2021, 5:56 PM

## 2021-01-21 NOTE — NC FL2 (Signed)
[place] LEVEL OF CARE SCREENING TOOL     IDENTIFICATION  Patient Name: Jimmy Le Birthdate: 1956-04-26 Sex: male Admission Date (Current Location): 01/18/2021  [facility] and [place] Number:  Herbalist and Address:  [facility], [place] [address], [place], [place] [address]      Provider Number: [id number]  Attending Physician Name and Address:  Dawley, Theodoro Doing, DO  Relative Name and Phone Number:       Current Level of Care: Hospital Recommended Level of Care: [place] Prior Approval Number:    Date Approved/Denied:   PASRR Number: [id number]  Discharge Plan: SNF    Current Diagnoses: Patient Active Problem List   Diagnosis Date Noted  . Thoracic spondylosis with myelopathy 01/18/2021  . 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 Neuropathy 11/23/2020  . Incontinence of feces 11/22/2020  . Gait abnormality 11/11/2020  . Paresthesia 11/11/2020  . Weakness 11/11/2020    Orientation RESPIRATION BLADDER Height & Weight     Self,Time,Situation,Place  Normal Incontinent Weight: 295 lb (133.8 kg) Height:  5\' 11"  (180.3 cm)  BEHAVIORAL SYMPTOMS/MOOD NEUROLOGICAL BOWEL NUTRITION STATUS      Incontinent Diet (regular)  AMBULATORY STATUS COMMUNICATION OF NEEDS Skin   Extensive Assist Verbally Surgical wounds (closed back incision, honeycomb dressing)                       Personal Care Assistance Level of Assistance  Bathing,Feeding,Dressing Bathing Assistance: Maximum assistance Feeding assistance: Limited assistance Dressing Assistance: Maximum assistance     Functional Limitations Info  Speech     Speech Info: Impaired (delayed responses)    SPECIAL CARE FACTORS FREQUENCY  PT (By licensed PT),OT (By licensed OT)     PT Frequency: 5x/wk OT Frequency: 5x/wk            Contractures Contractures Info: Not present    Additional Factors Info  Code  Status,Allergies,Psychotropic Code Status Info: Full Allergies Info: NKA Psychotropic Info: Abilify 10mg  daily at bed; Klonopin .5mg  2x/day; Lexapro 20mg  daily in the morning         Current Medications (01/21/2021):  This is the current hospital active medication list Current Facility-Administered Medications  Medication Dose Route Frequency Provider Last Rate Last Admin  . 0.9 %  sodium chloride infusion  250 mL Intravenous Continuous Dawley, Troy C, DO      . 0.9 %  sodium chloride infusion   Intravenous Continuous Dawley, Troy C, DO 50 mL/hr at 01/18/21 2157 New Bag at 01/18/21 2157  . acetaminophen (TYLENOL) tablet 650 mg  650 mg Oral Q4H PRN Dawley, Troy C, DO       Or  . acetaminophen (TYLENOL) suppository 650 mg  650 mg Rectal Q4H PRN Dawley, Troy C, DO      . alum & mag hydroxide-simeth (MAALOX/MYLANTA) 200-200-20 MG/5ML suspension 30 mL  30 mL Oral Q6H PRN Dawley, Troy C, DO      . ARIPiprazole (ABILIFY) tablet 10 mg  10 mg Oral QHS Dawley, Troy C, DO   10 mg at 01/20/21 2127  . baclofen (LIORESAL) tablet 10 mg  10 mg Oral TID Dawley, Troy C, DO   10 mg at 01/21/21 9563  . Chlorhexidine Gluconate Cloth 2 % PADS 6 each  6 each Topical Q0600 Dawley, Troy C, DO   6 each at 01/21/21 [phone]  . clonazePAM (KLONOPIN) tablet 0.5 mg  0.5 mg Oral BID Dawley,  Troy C, DO   0.5 mg at 01/21/21 7106  . escitalopram (LEXAPRO) tablet 20 mg  20 mg Oral q AM Dawley, Troy C, DO   20 mg at 01/21/21 0619  . heparin injection 5,000 Units  5,000 Units Subcutaneous Q12H Dawley, Troy C, DO   5,000 Units at 01/21/21 0936  . HYDROcodone-acetaminophen (NORCO) 10-325 MG per tablet 1 tablet  1 tablet Oral Q4H PRN Dawley, Troy C, DO   1 tablet at 01/21/21 2694  . HYDROcodone-acetaminophen (NORCO/VICODIN) 5-325 MG per tablet 1 tablet  1 tablet Oral Q4H PRN Dawley, Troy C, DO   1 tablet at 01/20/21 0415  . lactated ringers infusion   Intravenous Continuous Dawley, Troy C, DO 10 mL/hr at 01/18/21 0932 New Bag at  01/18/21 0932  . menthol-cetylpyridinium (CEPACOL) lozenge 3 mg  1 lozenge Oral PRN Dawley, Troy C, DO       Or  . phenol (CHLORASEPTIC) mouth spray 1 spray  1 spray Mouth/Throat PRN Dawley, Troy C, DO      . methocarbamol (ROBAXIN) tablet 500 mg  500 mg Oral Q6H PRN Dawley, Troy C, DO   500 mg at 01/20/21 1721   Or  . methocarbamol (ROBAXIN) 500 mg in dextrose 5 % 50 mL IVPB  500 mg Intravenous Q6H PRN Dawley, Troy C, DO      . morphine 2 MG/ML injection 2 mg  2 mg Intravenous Q3H PRN Dawley, Troy C, DO   2 mg at 01/20/21 1835  . mupirocin ointment (BACTROBAN) 2 % 1 application  1 application Nasal BID Dawley, Troy C, DO   1 application at 85/46/27 0937  . ondansetron (ZOFRAN) tablet 4 mg  4 mg Oral Q6H PRN Dawley, Troy C, DO       Or  . ondansetron (ZOFRAN) injection 4 mg  4 mg Intravenous Q6H PRN Dawley, Troy C, DO      . pantoprazole (PROTONIX) EC tablet 40 mg  40 mg Oral BID Dawley, Troy C, DO   40 mg at 01/21/21 0937  . senna-docusate (Senokot-S) tablet 1 tablet  1 tablet Oral QHS PRN Dawley, Troy C, DO      . sodium chloride flush (NS) 0.9 % injection 3 mL  3 mL Intravenous Q12H Dawley, Troy C, DO   3 mL at 01/21/21 0938  . sodium chloride flush (NS) 0.9 % injection 3 mL  3 mL Intravenous PRN Dawley, Troy C, DO         Discharge Medications: Please see discharge summary for a list of discharge medications.  Relevant Imaging Results:  Relevant Lab Results:   Additional Information SS#: [id number]  Geralynn Ochs, LCSW

## 2021-01-21 NOTE — Progress Notes (Signed)
   Providing Compassionate, Quality Care - Together  NEUROSURGERY PROGRESS NOTE   S: No issues overnight. 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  Please do not hesitate to call with questions or concerns.   Elwin Sleight, [facility] Cell: [phone]

## 2021-01-22 NOTE — TOC Progression Note (Signed)
Transition of Care [facility]) - Progression Note    Patient Details  Name: Jimmy Le MRN: [id number] Date of Birth: 1955/11/24  Transition of Care [facility]) CM/SW [place], [place] Phone Number: 01/22/2021, 3:02 PM  Clinical Narrative:    CSW spoke with pt's wife who stated that pt would like CSW to pick a place. CSW advised that she is unable to pick a place, but would be happy to go through the Medicare list with them. Pt and wife have chosen [facility]. [facility] noted they may have a bed available Monday. CSW started insurance and will request covid tomorrow. SW will continue to follow for DC needs.   Expected Discharge Plan: IP Rehab Facility Barriers to Discharge: Continued Medical Work up,Insurance Authorization  Expected Discharge Plan and Services Expected Discharge Plan: [place]     Post Acute Care Choice: NA Living arrangements for the past 2 months: Single Family Home                                       Social Determinants of Health (SDOH) Interventions    Readmission Risk Interventions No flowsheet data found.

## 2021-01-22 NOTE — Progress Notes (Signed)
  NEUROSURGERY PROGRESS NOTE   No issues overnight.  Numbness in BLE continues to improve   EXAM:  BP (!) 161/88 (BP Location: Left Arm)   Pulse 67   Temp 98.3 F (36.8 C) (Oral)   Resp 20   Ht 5\' 11"  (1.803 m)   Wt 133.8 kg   SpO2 97%   BMI 41.14 kg/m   Awake, alert, oriented  Speech fluent, appropriate  CN grossly intact  5/5 BUE 2/5 proximal BLE, 3/5 distal Incision: c/d/i  IMPRESSION/PLAN 65 y.o. male s/p T11-12 laminectomy. Doing well - SNF pending

## 2021-01-23 NOTE — Progress Notes (Signed)
NEUROSURGERY PROGRESS NOTE  S/p T11-T12 decompression for myelopathy Doing well. Complains of appropriate back soreness that actually is improving. It only mild today.  Continue supportive care and therapies today. Awaiting SNF placement  Temp:  [97.7 F (36.5 C)-98.8 F (37.1 C)] 97.7 F (36.5 C) (05/01 0433) Pulse Rate:  [57-70] 57 (05/01 0433) Resp:  [16-18] 18 (05/01 0433) BP: (135-184)/(72-100) 138/72 (05/01 0433) SpO2:  [95 %-97 %] 97 % (05/01 0433)   Eleonore Chiquito, NP 01/23/2021 7:01 AM

## 2021-01-24 DIAGNOSIS — I700 Atherosclerosis of aorta: Secondary | ICD-10-CM | POA: Diagnosis not present

## 2021-01-24 DIAGNOSIS — G629 Polyneuropathy, unspecified: Secondary | ICD-10-CM | POA: Diagnosis not present

## 2021-01-24 DIAGNOSIS — J982 Interstitial emphysema: Secondary | ICD-10-CM | POA: Diagnosis not present

## 2021-01-24 DIAGNOSIS — R0902 Hypoxemia: Secondary | ICD-10-CM | POA: Diagnosis not present

## 2021-01-24 DIAGNOSIS — L89150 Pressure ulcer of sacral region, unstageable: Secondary | ICD-10-CM | POA: Diagnosis not present

## 2021-01-24 DIAGNOSIS — R279 Unspecified lack of coordination: Secondary | ICD-10-CM | POA: Diagnosis not present

## 2021-01-24 DIAGNOSIS — G8310 Monoplegia of lower limb affecting unspecified side: Secondary | ICD-10-CM | POA: Diagnosis not present

## 2021-01-24 DIAGNOSIS — M6281 Muscle weakness (generalized): Secondary | ICD-10-CM | POA: Diagnosis not present

## 2021-01-24 DIAGNOSIS — M1990 Unspecified osteoarthritis, unspecified site: Secondary | ICD-10-CM | POA: Diagnosis not present

## 2021-01-24 DIAGNOSIS — R278 Other lack of coordination: Secondary | ICD-10-CM | POA: Diagnosis not present

## 2021-01-24 DIAGNOSIS — M4804 Spinal stenosis, thoracic region: Secondary | ICD-10-CM | POA: Diagnosis not present

## 2021-01-24 DIAGNOSIS — Z743 Need for continuous supervision: Secondary | ICD-10-CM | POA: Diagnosis not present

## 2021-01-24 DIAGNOSIS — Z9181 History of falling: Secondary | ICD-10-CM | POA: Diagnosis not present

## 2021-01-24 DIAGNOSIS — J449 Chronic obstructive pulmonary disease, unspecified: Secondary | ICD-10-CM | POA: Diagnosis not present

## 2021-01-24 DIAGNOSIS — R202 Paresthesia of skin: Secondary | ICD-10-CM | POA: Diagnosis not present

## 2021-01-24 DIAGNOSIS — G992 Myelopathy in diseases classified elsewhere: Secondary | ICD-10-CM | POA: Diagnosis not present

## 2021-01-24 DIAGNOSIS — R293 Abnormal posture: Secondary | ICD-10-CM | POA: Diagnosis not present

## 2021-01-24 DIAGNOSIS — R1310 Dysphagia, unspecified: Secondary | ICD-10-CM | POA: Diagnosis not present

## 2021-01-24 DIAGNOSIS — R251 Tremor, unspecified: Secondary | ICD-10-CM | POA: Diagnosis not present

## 2021-01-24 DIAGNOSIS — K219 Gastro-esophageal reflux disease without esophagitis: Secondary | ICD-10-CM | POA: Diagnosis not present

## 2021-01-24 DIAGNOSIS — M4714 Other spondylosis with myelopathy, thoracic region: Secondary | ICD-10-CM | POA: Diagnosis not present

## 2021-01-24 DIAGNOSIS — R262 Difficulty in walking, not elsewhere classified: Secondary | ICD-10-CM | POA: Diagnosis not present

## 2021-01-24 DIAGNOSIS — D649 Anemia, unspecified: Secondary | ICD-10-CM | POA: Diagnosis not present

## 2021-01-24 DIAGNOSIS — Z741 Need for assistance with personal care: Secondary | ICD-10-CM | POA: Diagnosis not present

## 2021-01-24 LAB — RESP PANEL BY RT-PCR (FLU A&B, COVID) ARPGX2
Influenza A by PCR: NEGATIVE
Influenza B by PCR: NEGATIVE
SARS Coronavirus 2 by RT PCR: NEGATIVE

## 2021-01-24 MED ORDER — HYDROCODONE-ACETAMINOPHEN 5-325 MG PO TABS: 1.0000 | ORAL_TABLET | ORAL | 0 refills | Status: AC | PRN

## 2021-01-24 MED ORDER — BACLOFEN 10 MG PO TABS: 10.0000 mg | ORAL_TABLET | Freq: Three times a day (TID) | ORAL | 0 refills | Status: AC

## 2021-01-24 MED ORDER — SENNOSIDES-DOCUSATE SODIUM 8.6-50 MG PO TABS: 1.0000 | ORAL_TABLET | Freq: Every evening | ORAL | 0 refills | Status: AC | PRN

## 2021-01-24 NOTE — Consult Note (Signed)
   [facility] Inpatient Consult   01/24/2021  CEDAR DITULLIO 05/17/1956 [id number]   [facility] [ACO] Patient:  [employer]    Primary Care Providers: Philmore Pali, NP, [place] is listed to provide the transition of care follow up when back in the community  Patient was screened to assess for restart needs for complex care management with [facility].  Patient is being recommended for a skilled nursing  facility care for rehab for post hospital transition.   Plan:  Needs are to be met at a skilled nursing level of care for transitional care needs.  For questions or referrals, please contact:   Natividad Brood, RN BSN [facility] Liaison  [phone] business mobile phone Toll free office [phone]  Fax number: [phone] [email or web address]

## 2021-01-24 NOTE — Progress Notes (Signed)
Occupational Therapy Treatment Patient Details Name: Jimmy Le MRN: [id number] DOB: 1956-03-17 Today's Date: 01/24/2021    History of present illness 65 y.o. 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 PMH includes COPD, neuropathy, tremors, OA, anxiety, depression, HOH.   OT comments  Pt progressing towards established OT goals. Pt performing bed mobility with Max A and then sit<>stand with Max A +2 and stedy. Pt performing grooming task at sink with Min Guard-Min A for sitting balance (depending on UE support). Pt performing sit<>stand x3 with Max A+2 for peri care after bowel incontinence; total A for peri care. Pt continues to present with high motivation. Pt denied CIR and will continue to need post-acute rehab at SNF level. Will continue to follow acutely as admitted.   Follow Up Recommendations  SNF (CIR denied)    Equipment Recommendations  Other (comment) (power wheelchair with hand control)    Recommendations for Other Services Rehab consult    Precautions / Restrictions Precautions Precautions: Back;Fall;Other (comment) Precaution Booklet Issued: Yes (comment) Precaution Comments: Bladder/bowel incontinence; bilateral PRAFOs; pt unable to recall back precautions from yesterday's session, reviewed and handout provided (pt with little to no teachback, very distracted by pain)       Mobility Bed Mobility Overal bed mobility: Needs Assistance Bed Mobility: Rolling;Sidelying to Sit Rolling: Max assist;+2 for safety/equipment Sidelying to sit: Max assist       General bed mobility comments: Pt performing roll to R with Max A +2 and then bringing BLES towards EOB with Max and elevating trunk.    Transfers Overall transfer level: Needs assistance Equipment used: Ambulation equipment used Transfers: Sit  to/from Stand Sit to Stand: Max assist;+2 physical assistance         General transfer comment: Max A +2 for power up into standing    Balance Overall balance assessment: Needs assistance Sitting-balance support: Bilateral upper extremity supported;Feet supported;No upper extremity supported Sitting balance-Leahy Scale: Fair Sitting balance - Comments: Tolerated prolonged sitting EOB activity   Standing balance support: Bilateral upper extremity supported;During functional activity Standing balance-Leahy Scale: Zero Standing balance comment: Reliant on BUE support and external assist to maintain static standing in stedy frame with bilateral knees blocked                           ADL either performed or assessed with clinical judgement   ADL Overall ADL's : Needs assistance/impaired     Grooming: Wash/dry face;Min guard;Sitting;Minimal assistance Grooming Details (indicate cue type and reason): semi sitting on stedy. Requiring Min A for sitting balance during bilateral coorindation.                 Toilet Transfer: Maximal assistance;+2 for physical assistance;+2 for safety/equipment;Moderate assistance (sit<>stand with stedy) Toilet Transfer Details (indicate cue type and reason): Max A +2 to power up from [place] and Hygiene: Total assistance;+2 for physical assistance;Sit to/from stand Toileting - Clothing Manipulation Details (indicate cue type and reason): Total A for peri care after incontinence     Functional mobility during ADLs: Maximal assistance;+2 for physical assistance (sit<>stand with stedy) General ADL Comments: Pt performing sit<>stand with stedy and then transfering to sink for washing his face seated.  Pt then performing sit<>stand 3x for peri care. Demonstrating increased activity tolerance     Vision  Perception     Praxis      Cognition Arousal/Alertness: Awake/alert Behavior During Therapy: WFL  for tasks assessed/performed Overall Cognitive Status: Within Functional Limits for tasks assessed                                 General Comments: WFL for simple tasks; at times requiring increased time to answer questions/process info        Exercises     Shoulder Instructions       General Comments Noting red pressure spots at bottom and edema at scrotum.    Pertinent Vitals/ Pain       Pain Assessment: Faces Faces Pain Scale: Hurts even more Pain Location: Bilateral hips, back Pain Descriptors / Indicators: Operative site guarding;Guarding;Grimacing;Moaning Pain Intervention(s): Monitored during session;Limited activity within patient's tolerance;Repositioned  Home Living                                          Prior Functioning/Environment              Frequency  Min 2X/week        Progress Toward Goals  OT Goals(current goals can now be found in the care plan section)  Progress towards OT goals: Progressing toward goals  Acute Rehab OT Goals Patient Stated Goal: to be able to walk OT Goal Formulation: With patient/family Time For Goal Achievement: 02/02/21 Potential to Achieve Goals: Good ADL Goals Pt Will Perform Eating: with modified independence;with adaptive utensils;bed level Pt Will Perform Upper Body Bathing: with min assist;sitting Additional ADL Goal #1: pt will complete bed mobility mod (A) as precusror to adls. Additional ADL Goal #2: pt will complete sit<>Stand total +2 min (A) from elevate surface without LE blocking  Plan Discharge plan remains appropriate    Co-evaluation    PT/OT/SLP Co-Evaluation/Treatment: Yes Reason for Co-Treatment: For patient/therapist safety;To address functional/ADL transfers;Complexity of the patient's impairments (multi-system involvement)   OT goals addressed during session: ADL's and self-care      AM-PAC OT "6 Clicks" Daily Activity     Outcome Measure   Help from  another person eating meals?: A Little Help from another person taking care of personal grooming?: A Little Help from another person toileting, which includes using toliet, bedpan, or urinal?: Total Help from another person bathing (including washing, rinsing, drying)?: Total Help from another person to put on and taking off regular upper body clothing?: A Lot Help from another person to put on and taking off regular lower body clothing?: Total 6 Click Score: 11    End of Session Equipment Utilized During Treatment: Gait belt;Other (comment) (stedy)  OT Visit Diagnosis: Unsteadiness on feet (R26.81);Other abnormalities of gait and mobility (R26.89);Muscle weakness (generalized) (M62.81);Pain   Activity Tolerance Patient tolerated treatment well   Patient Left with call bell/phone within reach;in chair;with chair alarm set   Nurse Communication Mobility status;Need for lift equipment;Precautions        Time: 1100-1133 OT Time Calculation (min): 33 min  Charges: OT General Charges $OT Visit: 1 Visit OT Treatments $Self Care/Home Management : 8-22 mins  Section, OTR/L Acute Rehab Pager: [phone] Office: [place] 01/24/2021, 1:01 PM

## 2021-01-24 NOTE — TOC Transition Note (Signed)
Transition of Care [facility]) - CM/SW Discharge Note   Patient Details  Name: Jimmy Le MRN: [id number] Date of Birth: 1956-01-09  Transition [facility] Care [facility]) CM/SW Contact:  Geralynn Ochs, LCSW Phone Number: 01/24/2021, 1:14 PM   Clinical Narrative:   Nurse to call report to [phone], Room 3234    Final next level of care: Skilled Nursing Facility Barriers to Discharge: Barriers Resolved   Patient Goals and CMS Choice Patient states their goals for this hospitalization and ongoing recovery are:: to get rehab, but still unsure where CMS Medicare.gov Compare Post Acute Care list provided to:: Patient Choice offered to / list presented to : Patient  Discharge Placement              Patient chooses bed at: [facility] Patient to be transferred to facility by: [place] Name of family member notified: Electra Patient and family notified of of transfer: 01/24/21  Discharge Plan and Services     Post Acute Care Choice: NA                               Social Determinants of Health (SDOH) Interventions     Readmission Risk Interventions No flowsheet data found.

## 2021-01-24 NOTE — Discharge Summary (Signed)
   Physician Discharge Summary  Patient ID: Jimmy Le MRN: [id number] DOB/AGE: 1955/11/19 66 y.o.  Admit date: 01/18/2021 Discharge date: 01/24/2021  Admission Diagnoses:  1.  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 male presented to my office with 8 years of progressive lower extremity weakness.  Work-up revealed severe stenosis at T11-12 with cord signal change.  He underwent a T11-12 open laminectomy for decompression of the spinal cord and tolerated the surgery well.  Postoperatively his numbness and tingling significantly improved.  He was evaluated by PT/OT and agreed that rehab would be of benefit.  His pain was controlled on oral medication upon discharge.  He was tolerating a normal diet.  His incision was clean dry and intact.  He felt as though his strength was slightly improving as well.  Treatments: Surgery -T11-12 laminectomy  Discharge Exam: Blood pressure 124/67, pulse (!) 59, temperature 98.4 F (36.9 C), temperature source Oral, resp. rate 16, height 5\' 11"  (1.803 m), weight 133.8 kg, SpO2 93 %. Awake, alert, orientedx3 PERRL Speech fluent, appropriate CN grossly intact 4/5 BUE 2/5 proximally, 3/5 distally BLE Wound c/d/i  Disposition: Discharge disposition: 03-Skilled Nursing Facility       Discharge Instructions    Incentive spirometry RT   Complete by: As directed      Allergies as of 01/24/2021   No Known Allergies     Medication List    TAKE these medications   ARIPiprazole 10 MG tablet Commonly known as: ABILIFY Take 10 mg by mouth at bedtime.   baclofen 10 MG tablet Commonly known as: LIORESAL Take 1 tablet (10 mg total) by mouth 3 (three) times daily.   cholecalciferol 25 MCG (1000 UNIT) tablet Commonly known as:  VITAMIN D Take 1,000 Units by mouth in the morning.   clonazePAM 0.5 MG tablet Commonly known as: KLONOPIN Take 0.5 mg by mouth 2 (two) times daily.   escitalopram 20 MG tablet Commonly known as: LEXAPRO Take 20 mg by mouth in the morning.   ferrous sulfate 325 (65 FE) MG tablet Take 325 mg by mouth in the morning.   HYDROcodone-acetaminophen 5-325 MG tablet Commonly known as: NORCO/VICODIN Take 1 tablet by mouth every 4 (four) hours as needed for moderate pain ((score 4 to 6)).   pantoprazole 40 MG tablet Commonly known as: PROTONIX Take 40 mg by mouth 2 (two) times daily.   senna-docusate 8.6-50 MG tablet Commonly known as: Senokot-S Take 1 tablet by mouth at bedtime as needed for mild constipation.       Contact information for follow-up providers    Jadalynn Burr C, DO Follow up in 1 month(s).   Why: call for appointment Contact information: [address] [place] [address] [place] [phone]            Contact information for after-discharge care    Destination    [facility] .   Service: Skilled Nursing Contact information: [place] [phone]                  Signed: Karsten Ro 01/24/2021, 9:34 AM

## 2021-01-24 NOTE — Progress Notes (Signed)
Physical Therapy Treatment Patient Details Name: Jimmy Le MRN: [id number] DOB: 02-07-1956 Today's Date: 01/24/2021    History of Present Illness 65 y.o. 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 PMH includes COPD, neuropathy, tremors, OA, anxiety, depression, HOH.    PT Comments    Pt agreeable to OOB mobility today. Pt completed multiple sit<>stands during session with max +2 assist, aided by use of bariatric stedy device for UE support and elevated seat height. Pt demonstrating improving overall rehab tolerance, but continues to needs significant physical assist to perform. PT to continue to follow acutely.    Follow Up Recommendations  SNF;Supervision for mobility/OOB     Equipment Recommendations  Other (comment) (power w/c with hand control, pressure relief cushion)    Recommendations for Other Services       Precautions / Restrictions Precautions Precautions: Back;Fall;Other (comment) Precaution Booklet Issued: Yes (comment) Precaution Comments: Bladder/bowel incontinence; bilateral PRAFOs; poor knowledge and carryover of precautions Restrictions Weight Bearing Restrictions: No    Mobility  Bed Mobility Overal bed mobility: Needs Assistance Bed Mobility: Rolling;Sidelying to Sit Rolling: Max assist;+2 for safety/equipment Sidelying to sit: Max assist       General bed mobility comments: max +1-2 for rolling to R for trunk and LE management, trunk elevation off of bed, LE lifting and translation off of bed, and scooting to EOB with use of bed pad.    Transfers Overall transfer level: Needs assistance Equipment used: Ambulation equipment used Transfers: Sit to/from Stand Sit to Stand: Max assist;+2 physical assistance         General transfer comment: max assist +2 for power up, hip extension,  and steadying upon standing. STS x3, from EOB x1 and stedy x2.  Ambulation/Gait                 Stairs             Wheelchair Mobility    Modified Rankin (Stroke Patients Only)       Balance Overall balance assessment: Needs assistance Sitting-balance support: Bilateral upper extremity supported;Feet supported;No upper extremity supported Sitting balance-Leahy Scale: Fair Sitting balance - Comments: Tolerated prolonged sitting EOB activity   Standing balance support: Bilateral upper extremity supported;During functional activity Standing balance-Leahy Scale: Zero Standing balance comment: max +2; standing tolerance for pericare x1.5 minutes at a time                            Cognition Arousal/Alertness: Awake/alert Behavior During Therapy: WFL for tasks assessed/performed Overall Cognitive Status: Within Functional Limits for tasks assessed                                 General Comments: Increased time to responds to questions, pt with good affect and motivated to participate in PT      Exercises      General Comments General comments (skin integrity, edema, etc.): purple/red pressure areas along bilateral buttocks near gluteal cleft, sacrum, edema to scrotum, fecal incontinence during STS      Pertinent Vitals/Pain Pain Assessment: Faces Faces Pain Scale: Hurts even more Pain Location: Bilateral hips, back Pain Descriptors / Indicators: Operative site guarding;Guarding;Grimacing;Moaning Pain Intervention(s): Limited activity within patient's tolerance;Monitored during session;Repositioned    Home Living  Prior Function            PT Goals (current goals can now be found in the care plan section) Acute Rehab PT Goals Patient Stated Goal: to be able to walk PT Goal Formulation: With patient Time For Goal Achievement: 02/02/21 Potential to Achieve Goals: Fair Progress towards PT goals:  Progressing toward goals    Frequency    Min 3X/week      PT Plan Current plan remains appropriate    Co-evaluation PT/OT/SLP Co-Evaluation/Treatment: Yes Reason for Co-Treatment: For patient/therapist safety;To address functional/ADL transfers;Complexity of the patient's impairments (multi-system involvement) PT goals addressed during session: Mobility/safety with mobility;Balance;Proper use of DME OT goals addressed during session: ADL's and self-care      AM-PAC PT "6 Clicks" Mobility   Outcome Measure  Help needed turning from your back to your side while in a flat bed without using bedrails?: A Lot Help needed moving from lying on your back to sitting on the side of a flat bed without using bedrails?: A Lot Help needed moving to and from a bed to a chair (including a wheelchair)?: Total Help needed standing up from a chair using your arms (e.g., wheelchair or bedside chair)?: Total Help needed to walk in hospital room?: Total Help needed climbing 3-5 steps with a railing? : Total 6 Click Score: 8    End of Session Equipment Utilized During Treatment: Gait belt Activity Tolerance: Patient limited by fatigue Patient left: with call bell/phone within reach;in chair;with chair alarm set Nurse Communication: Mobility status;Need for lift equipment PT Visit Diagnosis: Other abnormalities of gait and mobility (R26.89);Other symptoms and signs involving the nervous system (R29.898);Muscle weakness (generalized) (M62.81)     Time: 2637-8588 PT Time Calculation (min) (ACUTE ONLY): 30 min  Charges:  $Therapeutic Activity: 8-22 mins                     Stacie Glaze, PT DPT Acute Rehabilitation Services Pager [phone]  Office [phone]    [place] Ruffin Pyo 01/24/2021, 1:39 PM

## 2021-01-24 NOTE — Progress Notes (Signed)
Call placed and discharge report given to [facility], [place]. Nurse verbalized discharge instructions. Discharge paperwork given to medic and patient on the way to facility.

## 2021-01-24 NOTE — Care Management Important Message (Signed)
Important Message  Patient Details  Name: Jimmy Le MRN: [id number] Date of Birth: 02-20-1956   Medicare Important Message Given:  Yes     Orbie Pyo 01/24/2021, 4:28 PM

## 2021-01-25 ENCOUNTER — Ambulatory Visit: Payer: Medicare Other | Admitting: Neurology

## 2021-01-25 DIAGNOSIS — M1990 Unspecified osteoarthritis, unspecified site: Secondary | ICD-10-CM | POA: Diagnosis not present

## 2021-01-25 DIAGNOSIS — M4714 Other spondylosis with myelopathy, thoracic region: Secondary | ICD-10-CM | POA: Diagnosis not present

## 2021-01-25 DIAGNOSIS — G629 Polyneuropathy, unspecified: Secondary | ICD-10-CM | POA: Diagnosis not present

## 2021-01-25 DIAGNOSIS — K219 Gastro-esophageal reflux disease without esophagitis: Secondary | ICD-10-CM | POA: Diagnosis not present

## 2021-01-25 DIAGNOSIS — R251 Tremor, unspecified: Secondary | ICD-10-CM | POA: Diagnosis not present

## 2021-01-25 DIAGNOSIS — D649 Anemia, unspecified: Secondary | ICD-10-CM | POA: Diagnosis not present

## 2021-01-25 DIAGNOSIS — J449 Chronic obstructive pulmonary disease, unspecified: Secondary | ICD-10-CM | POA: Diagnosis not present

## 2021-01-25 DIAGNOSIS — I700 Atherosclerosis of aorta: Secondary | ICD-10-CM | POA: Diagnosis not present

## 2021-01-28 DIAGNOSIS — D649 Anemia, unspecified: Secondary | ICD-10-CM | POA: Diagnosis not present

## 2021-01-28 DIAGNOSIS — J449 Chronic obstructive pulmonary disease, unspecified: Secondary | ICD-10-CM | POA: Diagnosis not present

## 2021-01-28 DIAGNOSIS — G629 Polyneuropathy, unspecified: Secondary | ICD-10-CM | POA: Diagnosis not present

## 2021-01-28 DIAGNOSIS — M4714 Other spondylosis with myelopathy, thoracic region: Secondary | ICD-10-CM | POA: Diagnosis not present

## 2021-02-01 DIAGNOSIS — M4714 Other spondylosis with myelopathy, thoracic region: Secondary | ICD-10-CM | POA: Diagnosis not present

## 2021-02-07 DIAGNOSIS — J449 Chronic obstructive pulmonary disease, unspecified: Secondary | ICD-10-CM | POA: Diagnosis not present

## 2021-02-07 DIAGNOSIS — M4714 Other spondylosis with myelopathy, thoracic region: Secondary | ICD-10-CM | POA: Diagnosis not present

## 2021-02-07 DIAGNOSIS — G629 Polyneuropathy, unspecified: Secondary | ICD-10-CM | POA: Diagnosis not present

## 2021-02-11 DIAGNOSIS — G629 Polyneuropathy, unspecified: Secondary | ICD-10-CM | POA: Diagnosis not present

## 2021-02-11 DIAGNOSIS — J449 Chronic obstructive pulmonary disease, unspecified: Secondary | ICD-10-CM | POA: Diagnosis not present

## 2021-02-11 DIAGNOSIS — M1990 Unspecified osteoarthritis, unspecified site: Secondary | ICD-10-CM | POA: Diagnosis not present

## 2021-02-11 DIAGNOSIS — M4714 Other spondylosis with myelopathy, thoracic region: Secondary | ICD-10-CM | POA: Diagnosis not present

## 2021-02-12 DIAGNOSIS — J982 Interstitial emphysema: Secondary | ICD-10-CM | POA: Diagnosis not present

## 2021-02-12 DIAGNOSIS — G8222 Paraplegia, incomplete: Secondary | ICD-10-CM | POA: Diagnosis not present

## 2021-02-12 DIAGNOSIS — R350 Frequency of micturition: Secondary | ICD-10-CM | POA: Diagnosis not present

## 2021-02-12 DIAGNOSIS — M4714 Other spondylosis with myelopathy, thoracic region: Secondary | ICD-10-CM | POA: Diagnosis not present

## 2021-02-12 DIAGNOSIS — I700 Atherosclerosis of aorta: Secondary | ICD-10-CM | POA: Diagnosis not present

## 2021-02-12 DIAGNOSIS — K319 Disease of stomach and duodenum, unspecified: Secondary | ICD-10-CM | POA: Diagnosis not present

## 2021-02-12 DIAGNOSIS — Z4789 Encounter for other orthopedic aftercare: Secondary | ICD-10-CM | POA: Diagnosis not present

## 2021-02-12 DIAGNOSIS — M1990 Unspecified osteoarthritis, unspecified site: Secondary | ICD-10-CM | POA: Diagnosis not present

## 2021-02-12 DIAGNOSIS — D649 Anemia, unspecified: Secondary | ICD-10-CM | POA: Diagnosis not present

## 2021-02-12 DIAGNOSIS — F17210 Nicotine dependence, cigarettes, uncomplicated: Secondary | ICD-10-CM | POA: Diagnosis not present

## 2021-02-12 DIAGNOSIS — Z9181 History of falling: Secondary | ICD-10-CM | POA: Diagnosis not present

## 2021-02-18 DIAGNOSIS — J982 Interstitial emphysema: Secondary | ICD-10-CM | POA: Diagnosis not present

## 2021-02-18 DIAGNOSIS — M1990 Unspecified osteoarthritis, unspecified site: Secondary | ICD-10-CM | POA: Diagnosis not present

## 2021-02-18 DIAGNOSIS — K319 Disease of stomach and duodenum, unspecified: Secondary | ICD-10-CM | POA: Diagnosis not present

## 2021-02-18 DIAGNOSIS — F17210 Nicotine dependence, cigarettes, uncomplicated: Secondary | ICD-10-CM | POA: Diagnosis not present

## 2021-02-18 DIAGNOSIS — M4714 Other spondylosis with myelopathy, thoracic region: Secondary | ICD-10-CM | POA: Diagnosis not present

## 2021-02-18 DIAGNOSIS — Z9181 History of falling: Secondary | ICD-10-CM | POA: Diagnosis not present

## 2021-02-18 DIAGNOSIS — I700 Atherosclerosis of aorta: Secondary | ICD-10-CM | POA: Diagnosis not present

## 2021-02-18 DIAGNOSIS — G8222 Paraplegia, incomplete: Secondary | ICD-10-CM | POA: Diagnosis not present

## 2021-02-18 DIAGNOSIS — Z4789 Encounter for other orthopedic aftercare: Secondary | ICD-10-CM | POA: Diagnosis not present

## 2021-02-18 DIAGNOSIS — D649 Anemia, unspecified: Secondary | ICD-10-CM | POA: Diagnosis not present

## 2021-02-18 DIAGNOSIS — R350 Frequency of micturition: Secondary | ICD-10-CM | POA: Diagnosis not present

## 2021-02-22 DIAGNOSIS — R350 Frequency of micturition: Secondary | ICD-10-CM | POA: Diagnosis not present

## 2021-02-22 DIAGNOSIS — Z9181 History of falling: Secondary | ICD-10-CM | POA: Diagnosis not present

## 2021-02-22 DIAGNOSIS — M4714 Other spondylosis with myelopathy, thoracic region: Secondary | ICD-10-CM | POA: Diagnosis not present

## 2021-02-22 DIAGNOSIS — G8222 Paraplegia, incomplete: Secondary | ICD-10-CM | POA: Diagnosis not present

## 2021-02-22 DIAGNOSIS — Z4789 Encounter for other orthopedic aftercare: Secondary | ICD-10-CM | POA: Diagnosis not present

## 2021-02-22 DIAGNOSIS — I700 Atherosclerosis of aorta: Secondary | ICD-10-CM | POA: Diagnosis not present

## 2021-02-22 DIAGNOSIS — K319 Disease of stomach and duodenum, unspecified: Secondary | ICD-10-CM | POA: Diagnosis not present

## 2021-02-22 DIAGNOSIS — F17210 Nicotine dependence, cigarettes, uncomplicated: Secondary | ICD-10-CM | POA: Diagnosis not present

## 2021-02-22 DIAGNOSIS — M1990 Unspecified osteoarthritis, unspecified site: Secondary | ICD-10-CM | POA: Diagnosis not present

## 2021-02-22 DIAGNOSIS — D649 Anemia, unspecified: Secondary | ICD-10-CM | POA: Diagnosis not present

## 2021-02-22 DIAGNOSIS — J982 Interstitial emphysema: Secondary | ICD-10-CM | POA: Diagnosis not present

## 2021-02-24 DIAGNOSIS — M1990 Unspecified osteoarthritis, unspecified site: Secondary | ICD-10-CM | POA: Diagnosis not present

## 2021-02-24 DIAGNOSIS — Z9181 History of falling: Secondary | ICD-10-CM | POA: Diagnosis not present

## 2021-02-24 DIAGNOSIS — I700 Atherosclerosis of aorta: Secondary | ICD-10-CM | POA: Diagnosis not present

## 2021-02-24 DIAGNOSIS — M4714 Other spondylosis with myelopathy, thoracic region: Secondary | ICD-10-CM | POA: Diagnosis not present

## 2021-02-24 DIAGNOSIS — J982 Interstitial emphysema: Secondary | ICD-10-CM | POA: Diagnosis not present

## 2021-02-24 DIAGNOSIS — F17210 Nicotine dependence, cigarettes, uncomplicated: Secondary | ICD-10-CM | POA: Diagnosis not present

## 2021-02-24 DIAGNOSIS — D649 Anemia, unspecified: Secondary | ICD-10-CM | POA: Diagnosis not present

## 2021-02-24 DIAGNOSIS — K319 Disease of stomach and duodenum, unspecified: Secondary | ICD-10-CM | POA: Diagnosis not present

## 2021-02-24 DIAGNOSIS — G8222 Paraplegia, incomplete: Secondary | ICD-10-CM | POA: Diagnosis not present

## 2021-02-24 DIAGNOSIS — Z4789 Encounter for other orthopedic aftercare: Secondary | ICD-10-CM | POA: Diagnosis not present

## 2021-02-24 DIAGNOSIS — R350 Frequency of micturition: Secondary | ICD-10-CM | POA: Diagnosis not present

## 2021-02-28 DIAGNOSIS — F17210 Nicotine dependence, cigarettes, uncomplicated: Secondary | ICD-10-CM | POA: Diagnosis not present

## 2021-02-28 DIAGNOSIS — D649 Anemia, unspecified: Secondary | ICD-10-CM | POA: Diagnosis not present

## 2021-02-28 DIAGNOSIS — I700 Atherosclerosis of aorta: Secondary | ICD-10-CM | POA: Diagnosis not present

## 2021-02-28 DIAGNOSIS — M4714 Other spondylosis with myelopathy, thoracic region: Secondary | ICD-10-CM | POA: Diagnosis not present

## 2021-02-28 DIAGNOSIS — G8222 Paraplegia, incomplete: Secondary | ICD-10-CM | POA: Diagnosis not present

## 2021-02-28 DIAGNOSIS — J982 Interstitial emphysema: Secondary | ICD-10-CM | POA: Diagnosis not present

## 2021-02-28 DIAGNOSIS — Z9181 History of falling: Secondary | ICD-10-CM | POA: Diagnosis not present

## 2021-02-28 DIAGNOSIS — K319 Disease of stomach and duodenum, unspecified: Secondary | ICD-10-CM | POA: Diagnosis not present

## 2021-02-28 DIAGNOSIS — R350 Frequency of micturition: Secondary | ICD-10-CM | POA: Diagnosis not present

## 2021-02-28 DIAGNOSIS — Z4789 Encounter for other orthopedic aftercare: Secondary | ICD-10-CM | POA: Diagnosis not present

## 2021-02-28 DIAGNOSIS — M1990 Unspecified osteoarthritis, unspecified site: Secondary | ICD-10-CM | POA: Diagnosis not present

## 2021-03-02 DIAGNOSIS — G8222 Paraplegia, incomplete: Secondary | ICD-10-CM | POA: Diagnosis not present

## 2021-03-02 DIAGNOSIS — Z9181 History of falling: Secondary | ICD-10-CM | POA: Diagnosis not present

## 2021-03-02 DIAGNOSIS — F17210 Nicotine dependence, cigarettes, uncomplicated: Secondary | ICD-10-CM | POA: Diagnosis not present

## 2021-03-02 DIAGNOSIS — R350 Frequency of micturition: Secondary | ICD-10-CM | POA: Diagnosis not present

## 2021-03-02 DIAGNOSIS — I700 Atherosclerosis of aorta: Secondary | ICD-10-CM | POA: Diagnosis not present

## 2021-03-02 DIAGNOSIS — M4714 Other spondylosis with myelopathy, thoracic region: Secondary | ICD-10-CM | POA: Diagnosis not present

## 2021-03-02 DIAGNOSIS — D649 Anemia, unspecified: Secondary | ICD-10-CM | POA: Diagnosis not present

## 2021-03-02 DIAGNOSIS — J982 Interstitial emphysema: Secondary | ICD-10-CM | POA: Diagnosis not present

## 2021-03-02 DIAGNOSIS — Z4789 Encounter for other orthopedic aftercare: Secondary | ICD-10-CM | POA: Diagnosis not present

## 2021-03-02 DIAGNOSIS — K319 Disease of stomach and duodenum, unspecified: Secondary | ICD-10-CM | POA: Diagnosis not present

## 2021-03-02 DIAGNOSIS — M1990 Unspecified osteoarthritis, unspecified site: Secondary | ICD-10-CM | POA: Diagnosis not present

## 2021-03-03 DIAGNOSIS — M4804 Spinal stenosis, thoracic region: Secondary | ICD-10-CM | POA: Diagnosis not present

## 2021-03-03 DIAGNOSIS — G8310 Monoplegia of lower limb affecting unspecified side: Secondary | ICD-10-CM | POA: Diagnosis not present

## 2021-03-03 DIAGNOSIS — F17200 Nicotine dependence, unspecified, uncomplicated: Secondary | ICD-10-CM | POA: Diagnosis not present

## 2021-03-03 DIAGNOSIS — I700 Atherosclerosis of aorta: Secondary | ICD-10-CM | POA: Diagnosis not present

## 2021-03-03 DIAGNOSIS — D649 Anemia, unspecified: Secondary | ICD-10-CM | POA: Diagnosis not present

## 2021-03-03 DIAGNOSIS — M6281 Muscle weakness (generalized): Secondary | ICD-10-CM | POA: Diagnosis not present

## 2021-03-04 DIAGNOSIS — F17210 Nicotine dependence, cigarettes, uncomplicated: Secondary | ICD-10-CM | POA: Diagnosis not present

## 2021-03-04 DIAGNOSIS — I700 Atherosclerosis of aorta: Secondary | ICD-10-CM | POA: Diagnosis not present

## 2021-03-04 DIAGNOSIS — G8222 Paraplegia, incomplete: Secondary | ICD-10-CM | POA: Diagnosis not present

## 2021-03-04 DIAGNOSIS — M4714 Other spondylosis with myelopathy, thoracic region: Secondary | ICD-10-CM | POA: Diagnosis not present

## 2021-03-04 DIAGNOSIS — D649 Anemia, unspecified: Secondary | ICD-10-CM | POA: Diagnosis not present

## 2021-03-04 DIAGNOSIS — R350 Frequency of micturition: Secondary | ICD-10-CM | POA: Diagnosis not present

## 2021-03-04 DIAGNOSIS — Z4789 Encounter for other orthopedic aftercare: Secondary | ICD-10-CM | POA: Diagnosis not present

## 2021-03-04 DIAGNOSIS — K319 Disease of stomach and duodenum, unspecified: Secondary | ICD-10-CM | POA: Diagnosis not present

## 2021-03-04 DIAGNOSIS — Z9181 History of falling: Secondary | ICD-10-CM | POA: Diagnosis not present

## 2021-03-04 DIAGNOSIS — J982 Interstitial emphysema: Secondary | ICD-10-CM | POA: Diagnosis not present

## 2021-03-04 DIAGNOSIS — M1990 Unspecified osteoarthritis, unspecified site: Secondary | ICD-10-CM | POA: Diagnosis not present

## 2021-03-07 DIAGNOSIS — G8222 Paraplegia, incomplete: Secondary | ICD-10-CM | POA: Diagnosis not present

## 2021-03-07 DIAGNOSIS — J982 Interstitial emphysema: Secondary | ICD-10-CM | POA: Diagnosis not present

## 2021-03-07 DIAGNOSIS — R350 Frequency of micturition: Secondary | ICD-10-CM | POA: Diagnosis not present

## 2021-03-07 DIAGNOSIS — Z4789 Encounter for other orthopedic aftercare: Secondary | ICD-10-CM | POA: Diagnosis not present

## 2021-03-07 DIAGNOSIS — M4714 Other spondylosis with myelopathy, thoracic region: Secondary | ICD-10-CM | POA: Diagnosis not present

## 2021-03-07 DIAGNOSIS — M1990 Unspecified osteoarthritis, unspecified site: Secondary | ICD-10-CM | POA: Diagnosis not present

## 2021-03-07 DIAGNOSIS — Z9181 History of falling: Secondary | ICD-10-CM | POA: Diagnosis not present

## 2021-03-07 DIAGNOSIS — F17210 Nicotine dependence, cigarettes, uncomplicated: Secondary | ICD-10-CM | POA: Diagnosis not present

## 2021-03-07 DIAGNOSIS — K319 Disease of stomach and duodenum, unspecified: Secondary | ICD-10-CM | POA: Diagnosis not present

## 2021-03-07 DIAGNOSIS — I700 Atherosclerosis of aorta: Secondary | ICD-10-CM | POA: Diagnosis not present

## 2021-03-07 DIAGNOSIS — D649 Anemia, unspecified: Secondary | ICD-10-CM | POA: Diagnosis not present

## 2021-03-08 DIAGNOSIS — J982 Interstitial emphysema: Secondary | ICD-10-CM | POA: Diagnosis not present

## 2021-03-08 DIAGNOSIS — L89150 Pressure ulcer of sacral region, unstageable: Secondary | ICD-10-CM | POA: Diagnosis not present

## 2021-03-08 DIAGNOSIS — G8310 Monoplegia of lower limb affecting unspecified side: Secondary | ICD-10-CM | POA: Diagnosis not present

## 2021-03-09 DIAGNOSIS — R350 Frequency of micturition: Secondary | ICD-10-CM | POA: Diagnosis not present

## 2021-03-09 DIAGNOSIS — Z4789 Encounter for other orthopedic aftercare: Secondary | ICD-10-CM | POA: Diagnosis not present

## 2021-03-09 DIAGNOSIS — K319 Disease of stomach and duodenum, unspecified: Secondary | ICD-10-CM | POA: Diagnosis not present

## 2021-03-09 DIAGNOSIS — G8222 Paraplegia, incomplete: Secondary | ICD-10-CM | POA: Diagnosis not present

## 2021-03-09 DIAGNOSIS — F17210 Nicotine dependence, cigarettes, uncomplicated: Secondary | ICD-10-CM | POA: Diagnosis not present

## 2021-03-09 DIAGNOSIS — I700 Atherosclerosis of aorta: Secondary | ICD-10-CM | POA: Diagnosis not present

## 2021-03-09 DIAGNOSIS — Z9181 History of falling: Secondary | ICD-10-CM | POA: Diagnosis not present

## 2021-03-09 DIAGNOSIS — M1990 Unspecified osteoarthritis, unspecified site: Secondary | ICD-10-CM | POA: Diagnosis not present

## 2021-03-09 DIAGNOSIS — M4714 Other spondylosis with myelopathy, thoracic region: Secondary | ICD-10-CM | POA: Diagnosis not present

## 2021-03-09 DIAGNOSIS — J982 Interstitial emphysema: Secondary | ICD-10-CM | POA: Diagnosis not present

## 2021-03-09 DIAGNOSIS — D649 Anemia, unspecified: Secondary | ICD-10-CM | POA: Diagnosis not present

## 2021-03-11 DIAGNOSIS — Z4789 Encounter for other orthopedic aftercare: Secondary | ICD-10-CM | POA: Diagnosis not present

## 2021-03-11 DIAGNOSIS — R350 Frequency of micturition: Secondary | ICD-10-CM | POA: Diagnosis not present

## 2021-03-11 DIAGNOSIS — M4714 Other spondylosis with myelopathy, thoracic region: Secondary | ICD-10-CM | POA: Diagnosis not present

## 2021-03-11 DIAGNOSIS — Z9181 History of falling: Secondary | ICD-10-CM | POA: Diagnosis not present

## 2021-03-11 DIAGNOSIS — F17210 Nicotine dependence, cigarettes, uncomplicated: Secondary | ICD-10-CM | POA: Diagnosis not present

## 2021-03-11 DIAGNOSIS — I700 Atherosclerosis of aorta: Secondary | ICD-10-CM | POA: Diagnosis not present

## 2021-03-11 DIAGNOSIS — K319 Disease of stomach and duodenum, unspecified: Secondary | ICD-10-CM | POA: Diagnosis not present

## 2021-03-11 DIAGNOSIS — G8222 Paraplegia, incomplete: Secondary | ICD-10-CM | POA: Diagnosis not present

## 2021-03-11 DIAGNOSIS — M1990 Unspecified osteoarthritis, unspecified site: Secondary | ICD-10-CM | POA: Diagnosis not present

## 2021-03-11 DIAGNOSIS — J982 Interstitial emphysema: Secondary | ICD-10-CM | POA: Diagnosis not present

## 2021-03-11 DIAGNOSIS — D649 Anemia, unspecified: Secondary | ICD-10-CM | POA: Diagnosis not present

## 2021-03-14 DIAGNOSIS — R350 Frequency of micturition: Secondary | ICD-10-CM | POA: Diagnosis not present

## 2021-03-14 DIAGNOSIS — Z9181 History of falling: Secondary | ICD-10-CM | POA: Diagnosis not present

## 2021-03-14 DIAGNOSIS — I700 Atherosclerosis of aorta: Secondary | ICD-10-CM | POA: Diagnosis not present

## 2021-03-14 DIAGNOSIS — M4714 Other spondylosis with myelopathy, thoracic region: Secondary | ICD-10-CM | POA: Diagnosis not present

## 2021-03-14 DIAGNOSIS — Z4789 Encounter for other orthopedic aftercare: Secondary | ICD-10-CM | POA: Diagnosis not present

## 2021-03-14 DIAGNOSIS — J982 Interstitial emphysema: Secondary | ICD-10-CM | POA: Diagnosis not present

## 2021-03-14 DIAGNOSIS — F17210 Nicotine dependence, cigarettes, uncomplicated: Secondary | ICD-10-CM | POA: Diagnosis not present

## 2021-03-14 DIAGNOSIS — M1990 Unspecified osteoarthritis, unspecified site: Secondary | ICD-10-CM | POA: Diagnosis not present

## 2021-03-14 DIAGNOSIS — K319 Disease of stomach and duodenum, unspecified: Secondary | ICD-10-CM | POA: Diagnosis not present

## 2021-03-14 DIAGNOSIS — D649 Anemia, unspecified: Secondary | ICD-10-CM | POA: Diagnosis not present

## 2021-03-14 DIAGNOSIS — G8222 Paraplegia, incomplete: Secondary | ICD-10-CM | POA: Diagnosis not present

## 2021-03-16 DIAGNOSIS — I700 Atherosclerosis of aorta: Secondary | ICD-10-CM | POA: Diagnosis not present

## 2021-03-16 DIAGNOSIS — M1990 Unspecified osteoarthritis, unspecified site: Secondary | ICD-10-CM | POA: Diagnosis not present

## 2021-03-16 DIAGNOSIS — F17210 Nicotine dependence, cigarettes, uncomplicated: Secondary | ICD-10-CM | POA: Diagnosis not present

## 2021-03-16 DIAGNOSIS — M4714 Other spondylosis with myelopathy, thoracic region: Secondary | ICD-10-CM | POA: Diagnosis not present

## 2021-03-16 DIAGNOSIS — J982 Interstitial emphysema: Secondary | ICD-10-CM | POA: Diagnosis not present

## 2021-03-16 DIAGNOSIS — K319 Disease of stomach and duodenum, unspecified: Secondary | ICD-10-CM | POA: Diagnosis not present

## 2021-03-16 DIAGNOSIS — R350 Frequency of micturition: Secondary | ICD-10-CM | POA: Diagnosis not present

## 2021-03-16 DIAGNOSIS — Z9181 History of falling: Secondary | ICD-10-CM | POA: Diagnosis not present

## 2021-03-16 DIAGNOSIS — D649 Anemia, unspecified: Secondary | ICD-10-CM | POA: Diagnosis not present

## 2021-03-16 DIAGNOSIS — Z4789 Encounter for other orthopedic aftercare: Secondary | ICD-10-CM | POA: Diagnosis not present

## 2021-03-16 DIAGNOSIS — G8222 Paraplegia, incomplete: Secondary | ICD-10-CM | POA: Diagnosis not present

## 2021-03-18 DIAGNOSIS — J982 Interstitial emphysema: Secondary | ICD-10-CM | POA: Diagnosis not present

## 2021-03-18 DIAGNOSIS — R350 Frequency of micturition: Secondary | ICD-10-CM | POA: Diagnosis not present

## 2021-03-18 DIAGNOSIS — I700 Atherosclerosis of aorta: Secondary | ICD-10-CM | POA: Diagnosis not present

## 2021-03-18 DIAGNOSIS — Z9181 History of falling: Secondary | ICD-10-CM | POA: Diagnosis not present

## 2021-03-18 DIAGNOSIS — K319 Disease of stomach and duodenum, unspecified: Secondary | ICD-10-CM | POA: Diagnosis not present

## 2021-03-18 DIAGNOSIS — G8222 Paraplegia, incomplete: Secondary | ICD-10-CM | POA: Diagnosis not present

## 2021-03-18 DIAGNOSIS — F17210 Nicotine dependence, cigarettes, uncomplicated: Secondary | ICD-10-CM | POA: Diagnosis not present

## 2021-03-18 DIAGNOSIS — M1990 Unspecified osteoarthritis, unspecified site: Secondary | ICD-10-CM | POA: Diagnosis not present

## 2021-03-18 DIAGNOSIS — M4714 Other spondylosis with myelopathy, thoracic region: Secondary | ICD-10-CM | POA: Diagnosis not present

## 2021-03-18 DIAGNOSIS — D649 Anemia, unspecified: Secondary | ICD-10-CM | POA: Diagnosis not present

## 2021-03-18 DIAGNOSIS — Z4789 Encounter for other orthopedic aftercare: Secondary | ICD-10-CM | POA: Diagnosis not present

## 2021-03-22 DIAGNOSIS — R350 Frequency of micturition: Secondary | ICD-10-CM | POA: Diagnosis not present

## 2021-03-22 DIAGNOSIS — J982 Interstitial emphysema: Secondary | ICD-10-CM | POA: Diagnosis not present

## 2021-03-22 DIAGNOSIS — D649 Anemia, unspecified: Secondary | ICD-10-CM | POA: Diagnosis not present

## 2021-03-22 DIAGNOSIS — K319 Disease of stomach and duodenum, unspecified: Secondary | ICD-10-CM | POA: Diagnosis not present

## 2021-03-22 DIAGNOSIS — G8222 Paraplegia, incomplete: Secondary | ICD-10-CM | POA: Diagnosis not present

## 2021-03-22 DIAGNOSIS — Z4789 Encounter for other orthopedic aftercare: Secondary | ICD-10-CM | POA: Diagnosis not present

## 2021-03-22 DIAGNOSIS — F17210 Nicotine dependence, cigarettes, uncomplicated: Secondary | ICD-10-CM | POA: Diagnosis not present

## 2021-03-22 DIAGNOSIS — I700 Atherosclerosis of aorta: Secondary | ICD-10-CM | POA: Diagnosis not present

## 2021-03-22 DIAGNOSIS — M4714 Other spondylosis with myelopathy, thoracic region: Secondary | ICD-10-CM | POA: Diagnosis not present

## 2021-03-22 DIAGNOSIS — M1990 Unspecified osteoarthritis, unspecified site: Secondary | ICD-10-CM | POA: Diagnosis not present

## 2021-03-22 DIAGNOSIS — Z9181 History of falling: Secondary | ICD-10-CM | POA: Diagnosis not present

## 2021-03-23 DIAGNOSIS — R350 Frequency of micturition: Secondary | ICD-10-CM | POA: Diagnosis not present

## 2021-03-23 DIAGNOSIS — I700 Atherosclerosis of aorta: Secondary | ICD-10-CM | POA: Diagnosis not present

## 2021-03-23 DIAGNOSIS — F17210 Nicotine dependence, cigarettes, uncomplicated: Secondary | ICD-10-CM | POA: Diagnosis not present

## 2021-03-23 DIAGNOSIS — D649 Anemia, unspecified: Secondary | ICD-10-CM | POA: Diagnosis not present

## 2021-03-23 DIAGNOSIS — Z9181 History of falling: Secondary | ICD-10-CM | POA: Diagnosis not present

## 2021-03-23 DIAGNOSIS — K319 Disease of stomach and duodenum, unspecified: Secondary | ICD-10-CM | POA: Diagnosis not present

## 2021-03-23 DIAGNOSIS — G8222 Paraplegia, incomplete: Secondary | ICD-10-CM | POA: Diagnosis not present

## 2021-03-23 DIAGNOSIS — Z4789 Encounter for other orthopedic aftercare: Secondary | ICD-10-CM | POA: Diagnosis not present

## 2021-03-23 DIAGNOSIS — J982 Interstitial emphysema: Secondary | ICD-10-CM | POA: Diagnosis not present

## 2021-03-23 DIAGNOSIS — M1990 Unspecified osteoarthritis, unspecified site: Secondary | ICD-10-CM | POA: Diagnosis not present

## 2021-03-23 DIAGNOSIS — M4714 Other spondylosis with myelopathy, thoracic region: Secondary | ICD-10-CM | POA: Diagnosis not present

## 2021-03-24 DIAGNOSIS — K319 Disease of stomach and duodenum, unspecified: Secondary | ICD-10-CM | POA: Diagnosis not present

## 2021-03-24 DIAGNOSIS — I700 Atherosclerosis of aorta: Secondary | ICD-10-CM | POA: Diagnosis not present

## 2021-03-24 DIAGNOSIS — M1990 Unspecified osteoarthritis, unspecified site: Secondary | ICD-10-CM | POA: Diagnosis not present

## 2021-03-24 DIAGNOSIS — G8222 Paraplegia, incomplete: Secondary | ICD-10-CM | POA: Diagnosis not present

## 2021-03-24 DIAGNOSIS — J982 Interstitial emphysema: Secondary | ICD-10-CM | POA: Diagnosis not present

## 2021-03-24 DIAGNOSIS — Z4789 Encounter for other orthopedic aftercare: Secondary | ICD-10-CM | POA: Diagnosis not present

## 2021-03-24 DIAGNOSIS — F17210 Nicotine dependence, cigarettes, uncomplicated: Secondary | ICD-10-CM | POA: Diagnosis not present

## 2021-03-24 DIAGNOSIS — D649 Anemia, unspecified: Secondary | ICD-10-CM | POA: Diagnosis not present

## 2021-03-24 DIAGNOSIS — Z9181 History of falling: Secondary | ICD-10-CM | POA: Diagnosis not present

## 2021-03-24 DIAGNOSIS — R350 Frequency of micturition: Secondary | ICD-10-CM | POA: Diagnosis not present

## 2021-03-24 DIAGNOSIS — M4714 Other spondylosis with myelopathy, thoracic region: Secondary | ICD-10-CM | POA: Diagnosis not present

## 2021-03-30 DIAGNOSIS — D649 Anemia, unspecified: Secondary | ICD-10-CM | POA: Diagnosis not present

## 2021-03-30 DIAGNOSIS — F17210 Nicotine dependence, cigarettes, uncomplicated: Secondary | ICD-10-CM | POA: Diagnosis not present

## 2021-03-30 DIAGNOSIS — Z4789 Encounter for other orthopedic aftercare: Secondary | ICD-10-CM | POA: Diagnosis not present

## 2021-03-30 DIAGNOSIS — G8222 Paraplegia, incomplete: Secondary | ICD-10-CM | POA: Diagnosis not present

## 2021-03-30 DIAGNOSIS — Z9181 History of falling: Secondary | ICD-10-CM | POA: Diagnosis not present

## 2021-03-30 DIAGNOSIS — I700 Atherosclerosis of aorta: Secondary | ICD-10-CM | POA: Diagnosis not present

## 2021-03-30 DIAGNOSIS — K319 Disease of stomach and duodenum, unspecified: Secondary | ICD-10-CM | POA: Diagnosis not present

## 2021-03-30 DIAGNOSIS — J982 Interstitial emphysema: Secondary | ICD-10-CM | POA: Diagnosis not present

## 2021-03-30 DIAGNOSIS — R350 Frequency of micturition: Secondary | ICD-10-CM | POA: Diagnosis not present

## 2021-03-30 DIAGNOSIS — M1990 Unspecified osteoarthritis, unspecified site: Secondary | ICD-10-CM | POA: Diagnosis not present

## 2021-03-30 DIAGNOSIS — M4714 Other spondylosis with myelopathy, thoracic region: Secondary | ICD-10-CM | POA: Diagnosis not present

## 2021-04-01 DIAGNOSIS — F17210 Nicotine dependence, cigarettes, uncomplicated: Secondary | ICD-10-CM | POA: Diagnosis not present

## 2021-04-01 DIAGNOSIS — Z4789 Encounter for other orthopedic aftercare: Secondary | ICD-10-CM | POA: Diagnosis not present

## 2021-04-01 DIAGNOSIS — D649 Anemia, unspecified: Secondary | ICD-10-CM | POA: Diagnosis not present

## 2021-04-01 DIAGNOSIS — M1990 Unspecified osteoarthritis, unspecified site: Secondary | ICD-10-CM | POA: Diagnosis not present

## 2021-04-01 DIAGNOSIS — K319 Disease of stomach and duodenum, unspecified: Secondary | ICD-10-CM | POA: Diagnosis not present

## 2021-04-01 DIAGNOSIS — J982 Interstitial emphysema: Secondary | ICD-10-CM | POA: Diagnosis not present

## 2021-04-01 DIAGNOSIS — G8222 Paraplegia, incomplete: Secondary | ICD-10-CM | POA: Diagnosis not present

## 2021-04-01 DIAGNOSIS — R350 Frequency of micturition: Secondary | ICD-10-CM | POA: Diagnosis not present

## 2021-04-01 DIAGNOSIS — Z9181 History of falling: Secondary | ICD-10-CM | POA: Diagnosis not present

## 2021-04-01 DIAGNOSIS — M4714 Other spondylosis with myelopathy, thoracic region: Secondary | ICD-10-CM | POA: Diagnosis not present

## 2021-04-01 DIAGNOSIS — I700 Atherosclerosis of aorta: Secondary | ICD-10-CM | POA: Diagnosis not present

## 2021-04-06 DIAGNOSIS — R350 Frequency of micturition: Secondary | ICD-10-CM | POA: Diagnosis not present

## 2021-04-06 DIAGNOSIS — Z4789 Encounter for other orthopedic aftercare: Secondary | ICD-10-CM | POA: Diagnosis not present

## 2021-04-06 DIAGNOSIS — F17210 Nicotine dependence, cigarettes, uncomplicated: Secondary | ICD-10-CM | POA: Diagnosis not present

## 2021-04-06 DIAGNOSIS — D649 Anemia, unspecified: Secondary | ICD-10-CM | POA: Diagnosis not present

## 2021-04-06 DIAGNOSIS — J982 Interstitial emphysema: Secondary | ICD-10-CM | POA: Diagnosis not present

## 2021-04-06 DIAGNOSIS — I700 Atherosclerosis of aorta: Secondary | ICD-10-CM | POA: Diagnosis not present

## 2021-04-06 DIAGNOSIS — G8222 Paraplegia, incomplete: Secondary | ICD-10-CM | POA: Diagnosis not present

## 2021-04-06 DIAGNOSIS — K319 Disease of stomach and duodenum, unspecified: Secondary | ICD-10-CM | POA: Diagnosis not present

## 2021-04-06 DIAGNOSIS — Z9181 History of falling: Secondary | ICD-10-CM | POA: Diagnosis not present

## 2021-04-06 DIAGNOSIS — M1990 Unspecified osteoarthritis, unspecified site: Secondary | ICD-10-CM | POA: Diagnosis not present

## 2021-04-06 DIAGNOSIS — M4714 Other spondylosis with myelopathy, thoracic region: Secondary | ICD-10-CM | POA: Diagnosis not present

## 2021-04-07 DIAGNOSIS — G8310 Monoplegia of lower limb affecting unspecified side: Secondary | ICD-10-CM | POA: Diagnosis not present

## 2021-04-07 DIAGNOSIS — J982 Interstitial emphysema: Secondary | ICD-10-CM | POA: Diagnosis not present

## 2021-04-07 DIAGNOSIS — L89150 Pressure ulcer of sacral region, unstageable: Secondary | ICD-10-CM | POA: Diagnosis not present

## 2021-04-08 DIAGNOSIS — M1990 Unspecified osteoarthritis, unspecified site: Secondary | ICD-10-CM | POA: Diagnosis not present

## 2021-04-08 DIAGNOSIS — G8222 Paraplegia, incomplete: Secondary | ICD-10-CM | POA: Diagnosis not present

## 2021-04-08 DIAGNOSIS — M4714 Other spondylosis with myelopathy, thoracic region: Secondary | ICD-10-CM | POA: Diagnosis not present

## 2021-04-08 DIAGNOSIS — R350 Frequency of micturition: Secondary | ICD-10-CM | POA: Diagnosis not present

## 2021-04-08 DIAGNOSIS — Z9181 History of falling: Secondary | ICD-10-CM | POA: Diagnosis not present

## 2021-04-08 DIAGNOSIS — Z4789 Encounter for other orthopedic aftercare: Secondary | ICD-10-CM | POA: Diagnosis not present

## 2021-04-08 DIAGNOSIS — F17210 Nicotine dependence, cigarettes, uncomplicated: Secondary | ICD-10-CM | POA: Diagnosis not present

## 2021-04-08 DIAGNOSIS — J982 Interstitial emphysema: Secondary | ICD-10-CM | POA: Diagnosis not present

## 2021-04-08 DIAGNOSIS — I700 Atherosclerosis of aorta: Secondary | ICD-10-CM | POA: Diagnosis not present

## 2021-04-08 DIAGNOSIS — K319 Disease of stomach and duodenum, unspecified: Secondary | ICD-10-CM | POA: Diagnosis not present

## 2021-04-08 DIAGNOSIS — D649 Anemia, unspecified: Secondary | ICD-10-CM | POA: Diagnosis not present

## 2021-04-12 DIAGNOSIS — F17210 Nicotine dependence, cigarettes, uncomplicated: Secondary | ICD-10-CM | POA: Diagnosis not present

## 2021-04-12 DIAGNOSIS — I700 Atherosclerosis of aorta: Secondary | ICD-10-CM | POA: Diagnosis not present

## 2021-04-12 DIAGNOSIS — R350 Frequency of micturition: Secondary | ICD-10-CM | POA: Diagnosis not present

## 2021-04-12 DIAGNOSIS — K319 Disease of stomach and duodenum, unspecified: Secondary | ICD-10-CM | POA: Diagnosis not present

## 2021-04-12 DIAGNOSIS — Z9181 History of falling: Secondary | ICD-10-CM | POA: Diagnosis not present

## 2021-04-12 DIAGNOSIS — M4714 Other spondylosis with myelopathy, thoracic region: Secondary | ICD-10-CM | POA: Diagnosis not present

## 2021-04-12 DIAGNOSIS — J982 Interstitial emphysema: Secondary | ICD-10-CM | POA: Diagnosis not present

## 2021-04-12 DIAGNOSIS — G8222 Paraplegia, incomplete: Secondary | ICD-10-CM | POA: Diagnosis not present

## 2021-04-12 DIAGNOSIS — D649 Anemia, unspecified: Secondary | ICD-10-CM | POA: Diagnosis not present

## 2021-04-12 DIAGNOSIS — M1990 Unspecified osteoarthritis, unspecified site: Secondary | ICD-10-CM | POA: Diagnosis not present

## 2021-04-12 DIAGNOSIS — Z4789 Encounter for other orthopedic aftercare: Secondary | ICD-10-CM | POA: Diagnosis not present

## 2021-04-14 DIAGNOSIS — K319 Disease of stomach and duodenum, unspecified: Secondary | ICD-10-CM | POA: Diagnosis not present

## 2021-04-14 DIAGNOSIS — R350 Frequency of micturition: Secondary | ICD-10-CM | POA: Diagnosis not present

## 2021-04-14 DIAGNOSIS — J982 Interstitial emphysema: Secondary | ICD-10-CM | POA: Diagnosis not present

## 2021-04-14 DIAGNOSIS — M1990 Unspecified osteoarthritis, unspecified site: Secondary | ICD-10-CM | POA: Diagnosis not present

## 2021-04-14 DIAGNOSIS — I700 Atherosclerosis of aorta: Secondary | ICD-10-CM | POA: Diagnosis not present

## 2021-04-14 DIAGNOSIS — D649 Anemia, unspecified: Secondary | ICD-10-CM | POA: Diagnosis not present

## 2021-04-14 DIAGNOSIS — Z4789 Encounter for other orthopedic aftercare: Secondary | ICD-10-CM | POA: Diagnosis not present

## 2021-04-14 DIAGNOSIS — M4714 Other spondylosis with myelopathy, thoracic region: Secondary | ICD-10-CM | POA: Diagnosis not present

## 2021-04-14 DIAGNOSIS — F17210 Nicotine dependence, cigarettes, uncomplicated: Secondary | ICD-10-CM | POA: Diagnosis not present

## 2021-04-14 DIAGNOSIS — G8222 Paraplegia, incomplete: Secondary | ICD-10-CM | POA: Diagnosis not present

## 2021-04-14 DIAGNOSIS — Z9181 History of falling: Secondary | ICD-10-CM | POA: Diagnosis not present

## 2021-04-15 DIAGNOSIS — J982 Interstitial emphysema: Secondary | ICD-10-CM | POA: Diagnosis not present

## 2021-04-15 DIAGNOSIS — I700 Atherosclerosis of aorta: Secondary | ICD-10-CM | POA: Diagnosis not present

## 2021-04-15 DIAGNOSIS — F17210 Nicotine dependence, cigarettes, uncomplicated: Secondary | ICD-10-CM | POA: Diagnosis not present

## 2021-04-15 DIAGNOSIS — R350 Frequency of micturition: Secondary | ICD-10-CM | POA: Diagnosis not present

## 2021-04-15 DIAGNOSIS — M4714 Other spondylosis with myelopathy, thoracic region: Secondary | ICD-10-CM | POA: Diagnosis not present

## 2021-04-15 DIAGNOSIS — Z4789 Encounter for other orthopedic aftercare: Secondary | ICD-10-CM | POA: Diagnosis not present

## 2021-04-15 DIAGNOSIS — D649 Anemia, unspecified: Secondary | ICD-10-CM | POA: Diagnosis not present

## 2021-04-15 DIAGNOSIS — K319 Disease of stomach and duodenum, unspecified: Secondary | ICD-10-CM | POA: Diagnosis not present

## 2021-04-15 DIAGNOSIS — Z9181 History of falling: Secondary | ICD-10-CM | POA: Diagnosis not present

## 2021-04-15 DIAGNOSIS — M1990 Unspecified osteoarthritis, unspecified site: Secondary | ICD-10-CM | POA: Diagnosis not present

## 2021-04-15 DIAGNOSIS — G8222 Paraplegia, incomplete: Secondary | ICD-10-CM | POA: Diagnosis not present

## 2021-04-19 DIAGNOSIS — M4714 Other spondylosis with myelopathy, thoracic region: Secondary | ICD-10-CM | POA: Diagnosis not present

## 2021-04-19 DIAGNOSIS — D649 Anemia, unspecified: Secondary | ICD-10-CM | POA: Diagnosis not present

## 2021-04-19 DIAGNOSIS — I700 Atherosclerosis of aorta: Secondary | ICD-10-CM | POA: Diagnosis not present

## 2021-04-19 DIAGNOSIS — G8222 Paraplegia, incomplete: Secondary | ICD-10-CM | POA: Diagnosis not present

## 2021-04-19 DIAGNOSIS — Z9181 History of falling: Secondary | ICD-10-CM | POA: Diagnosis not present

## 2021-04-19 DIAGNOSIS — F17210 Nicotine dependence, cigarettes, uncomplicated: Secondary | ICD-10-CM | POA: Diagnosis not present

## 2021-04-19 DIAGNOSIS — M1990 Unspecified osteoarthritis, unspecified site: Secondary | ICD-10-CM | POA: Diagnosis not present

## 2021-04-19 DIAGNOSIS — J982 Interstitial emphysema: Secondary | ICD-10-CM | POA: Diagnosis not present

## 2021-04-19 DIAGNOSIS — Z4789 Encounter for other orthopedic aftercare: Secondary | ICD-10-CM | POA: Diagnosis not present

## 2021-04-19 DIAGNOSIS — K319 Disease of stomach and duodenum, unspecified: Secondary | ICD-10-CM | POA: Diagnosis not present

## 2021-04-19 DIAGNOSIS — R350 Frequency of micturition: Secondary | ICD-10-CM | POA: Diagnosis not present

## 2021-04-20 DIAGNOSIS — I700 Atherosclerosis of aorta: Secondary | ICD-10-CM | POA: Diagnosis not present

## 2021-04-20 DIAGNOSIS — Z9181 History of falling: Secondary | ICD-10-CM | POA: Diagnosis not present

## 2021-04-20 DIAGNOSIS — D649 Anemia, unspecified: Secondary | ICD-10-CM | POA: Diagnosis not present

## 2021-04-20 DIAGNOSIS — Z4789 Encounter for other orthopedic aftercare: Secondary | ICD-10-CM | POA: Diagnosis not present

## 2021-04-20 DIAGNOSIS — M4714 Other spondylosis with myelopathy, thoracic region: Secondary | ICD-10-CM | POA: Diagnosis not present

## 2021-04-20 DIAGNOSIS — J982 Interstitial emphysema: Secondary | ICD-10-CM | POA: Diagnosis not present

## 2021-04-20 DIAGNOSIS — M1990 Unspecified osteoarthritis, unspecified site: Secondary | ICD-10-CM | POA: Diagnosis not present

## 2021-04-20 DIAGNOSIS — K319 Disease of stomach and duodenum, unspecified: Secondary | ICD-10-CM | POA: Diagnosis not present

## 2021-04-20 DIAGNOSIS — F17210 Nicotine dependence, cigarettes, uncomplicated: Secondary | ICD-10-CM | POA: Diagnosis not present

## 2021-04-20 DIAGNOSIS — R350 Frequency of micturition: Secondary | ICD-10-CM | POA: Diagnosis not present

## 2021-04-20 DIAGNOSIS — G8222 Paraplegia, incomplete: Secondary | ICD-10-CM | POA: Diagnosis not present

## 2021-04-22 DIAGNOSIS — Z4789 Encounter for other orthopedic aftercare: Secondary | ICD-10-CM | POA: Diagnosis not present

## 2021-04-22 DIAGNOSIS — D649 Anemia, unspecified: Secondary | ICD-10-CM | POA: Diagnosis not present

## 2021-04-22 DIAGNOSIS — R350 Frequency of micturition: Secondary | ICD-10-CM | POA: Diagnosis not present

## 2021-04-22 DIAGNOSIS — K319 Disease of stomach and duodenum, unspecified: Secondary | ICD-10-CM | POA: Diagnosis not present

## 2021-04-22 DIAGNOSIS — Z9181 History of falling: Secondary | ICD-10-CM | POA: Diagnosis not present

## 2021-04-22 DIAGNOSIS — I700 Atherosclerosis of aorta: Secondary | ICD-10-CM | POA: Diagnosis not present

## 2021-04-22 DIAGNOSIS — J982 Interstitial emphysema: Secondary | ICD-10-CM | POA: Diagnosis not present

## 2021-04-22 DIAGNOSIS — M4714 Other spondylosis with myelopathy, thoracic region: Secondary | ICD-10-CM | POA: Diagnosis not present

## 2021-04-22 DIAGNOSIS — M1990 Unspecified osteoarthritis, unspecified site: Secondary | ICD-10-CM | POA: Diagnosis not present

## 2021-04-22 DIAGNOSIS — G8222 Paraplegia, incomplete: Secondary | ICD-10-CM | POA: Diagnosis not present

## 2021-04-22 DIAGNOSIS — F17210 Nicotine dependence, cigarettes, uncomplicated: Secondary | ICD-10-CM | POA: Diagnosis not present

## 2021-04-25 DIAGNOSIS — Z9181 History of falling: Secondary | ICD-10-CM | POA: Diagnosis not present

## 2021-04-25 DIAGNOSIS — F17210 Nicotine dependence, cigarettes, uncomplicated: Secondary | ICD-10-CM | POA: Diagnosis not present

## 2021-04-25 DIAGNOSIS — J982 Interstitial emphysema: Secondary | ICD-10-CM | POA: Diagnosis not present

## 2021-04-25 DIAGNOSIS — K319 Disease of stomach and duodenum, unspecified: Secondary | ICD-10-CM | POA: Diagnosis not present

## 2021-04-25 DIAGNOSIS — G8222 Paraplegia, incomplete: Secondary | ICD-10-CM | POA: Diagnosis not present

## 2021-04-25 DIAGNOSIS — R350 Frequency of micturition: Secondary | ICD-10-CM | POA: Diagnosis not present

## 2021-04-25 DIAGNOSIS — M4714 Other spondylosis with myelopathy, thoracic region: Secondary | ICD-10-CM | POA: Diagnosis not present

## 2021-04-25 DIAGNOSIS — M1990 Unspecified osteoarthritis, unspecified site: Secondary | ICD-10-CM | POA: Diagnosis not present

## 2021-04-25 DIAGNOSIS — D649 Anemia, unspecified: Secondary | ICD-10-CM | POA: Diagnosis not present

## 2021-04-25 DIAGNOSIS — Z4789 Encounter for other orthopedic aftercare: Secondary | ICD-10-CM | POA: Diagnosis not present

## 2021-04-25 DIAGNOSIS — I700 Atherosclerosis of aorta: Secondary | ICD-10-CM | POA: Diagnosis not present

## 2021-04-27 DIAGNOSIS — R350 Frequency of micturition: Secondary | ICD-10-CM | POA: Diagnosis not present

## 2021-04-27 DIAGNOSIS — D649 Anemia, unspecified: Secondary | ICD-10-CM | POA: Diagnosis not present

## 2021-04-27 DIAGNOSIS — K319 Disease of stomach and duodenum, unspecified: Secondary | ICD-10-CM | POA: Diagnosis not present

## 2021-04-27 DIAGNOSIS — I700 Atherosclerosis of aorta: Secondary | ICD-10-CM | POA: Diagnosis not present

## 2021-04-27 DIAGNOSIS — M4714 Other spondylosis with myelopathy, thoracic region: Secondary | ICD-10-CM | POA: Diagnosis not present

## 2021-04-27 DIAGNOSIS — Z9181 History of falling: Secondary | ICD-10-CM | POA: Diagnosis not present

## 2021-04-27 DIAGNOSIS — Z4789 Encounter for other orthopedic aftercare: Secondary | ICD-10-CM | POA: Diagnosis not present

## 2021-04-27 DIAGNOSIS — F17210 Nicotine dependence, cigarettes, uncomplicated: Secondary | ICD-10-CM | POA: Diagnosis not present

## 2021-04-27 DIAGNOSIS — J982 Interstitial emphysema: Secondary | ICD-10-CM | POA: Diagnosis not present

## 2021-04-27 DIAGNOSIS — M1990 Unspecified osteoarthritis, unspecified site: Secondary | ICD-10-CM | POA: Diagnosis not present

## 2021-04-27 DIAGNOSIS — G8222 Paraplegia, incomplete: Secondary | ICD-10-CM | POA: Diagnosis not present

## 2021-04-29 DIAGNOSIS — M4714 Other spondylosis with myelopathy, thoracic region: Secondary | ICD-10-CM | POA: Diagnosis not present

## 2021-04-29 DIAGNOSIS — Z9181 History of falling: Secondary | ICD-10-CM | POA: Diagnosis not present

## 2021-04-29 DIAGNOSIS — K319 Disease of stomach and duodenum, unspecified: Secondary | ICD-10-CM | POA: Diagnosis not present

## 2021-04-29 DIAGNOSIS — F17210 Nicotine dependence, cigarettes, uncomplicated: Secondary | ICD-10-CM | POA: Diagnosis not present

## 2021-04-29 DIAGNOSIS — J982 Interstitial emphysema: Secondary | ICD-10-CM | POA: Diagnosis not present

## 2021-04-29 DIAGNOSIS — M1990 Unspecified osteoarthritis, unspecified site: Secondary | ICD-10-CM | POA: Diagnosis not present

## 2021-04-29 DIAGNOSIS — R350 Frequency of micturition: Secondary | ICD-10-CM | POA: Diagnosis not present

## 2021-04-29 DIAGNOSIS — Z4789 Encounter for other orthopedic aftercare: Secondary | ICD-10-CM | POA: Diagnosis not present

## 2021-04-29 DIAGNOSIS — G8222 Paraplegia, incomplete: Secondary | ICD-10-CM | POA: Diagnosis not present

## 2021-04-29 DIAGNOSIS — D649 Anemia, unspecified: Secondary | ICD-10-CM | POA: Diagnosis not present

## 2021-04-29 DIAGNOSIS — I700 Atherosclerosis of aorta: Secondary | ICD-10-CM | POA: Diagnosis not present

## 2021-05-03 DIAGNOSIS — M1990 Unspecified osteoarthritis, unspecified site: Secondary | ICD-10-CM | POA: Diagnosis not present

## 2021-05-03 DIAGNOSIS — I700 Atherosclerosis of aorta: Secondary | ICD-10-CM | POA: Diagnosis not present

## 2021-05-03 DIAGNOSIS — M4714 Other spondylosis with myelopathy, thoracic region: Secondary | ICD-10-CM | POA: Diagnosis not present

## 2021-05-03 DIAGNOSIS — Z9181 History of falling: Secondary | ICD-10-CM | POA: Diagnosis not present

## 2021-05-03 DIAGNOSIS — R350 Frequency of micturition: Secondary | ICD-10-CM | POA: Diagnosis not present

## 2021-05-03 DIAGNOSIS — J982 Interstitial emphysema: Secondary | ICD-10-CM | POA: Diagnosis not present

## 2021-05-03 DIAGNOSIS — K319 Disease of stomach and duodenum, unspecified: Secondary | ICD-10-CM | POA: Diagnosis not present

## 2021-05-03 DIAGNOSIS — Z4789 Encounter for other orthopedic aftercare: Secondary | ICD-10-CM | POA: Diagnosis not present

## 2021-05-03 DIAGNOSIS — F17210 Nicotine dependence, cigarettes, uncomplicated: Secondary | ICD-10-CM | POA: Diagnosis not present

## 2021-05-03 DIAGNOSIS — G8222 Paraplegia, incomplete: Secondary | ICD-10-CM | POA: Diagnosis not present

## 2021-05-03 DIAGNOSIS — D649 Anemia, unspecified: Secondary | ICD-10-CM | POA: Diagnosis not present

## 2021-05-05 DIAGNOSIS — D649 Anemia, unspecified: Secondary | ICD-10-CM | POA: Diagnosis not present

## 2021-05-05 DIAGNOSIS — J982 Interstitial emphysema: Secondary | ICD-10-CM | POA: Diagnosis not present

## 2021-05-05 DIAGNOSIS — Z9181 History of falling: Secondary | ICD-10-CM | POA: Diagnosis not present

## 2021-05-05 DIAGNOSIS — R350 Frequency of micturition: Secondary | ICD-10-CM | POA: Diagnosis not present

## 2021-05-05 DIAGNOSIS — Z4789 Encounter for other orthopedic aftercare: Secondary | ICD-10-CM | POA: Diagnosis not present

## 2021-05-05 DIAGNOSIS — G8222 Paraplegia, incomplete: Secondary | ICD-10-CM | POA: Diagnosis not present

## 2021-05-05 DIAGNOSIS — K319 Disease of stomach and duodenum, unspecified: Secondary | ICD-10-CM | POA: Diagnosis not present

## 2021-05-05 DIAGNOSIS — I700 Atherosclerosis of aorta: Secondary | ICD-10-CM | POA: Diagnosis not present

## 2021-05-05 DIAGNOSIS — F17210 Nicotine dependence, cigarettes, uncomplicated: Secondary | ICD-10-CM | POA: Diagnosis not present

## 2021-05-05 DIAGNOSIS — M4714 Other spondylosis with myelopathy, thoracic region: Secondary | ICD-10-CM | POA: Diagnosis not present

## 2021-05-05 DIAGNOSIS — M1990 Unspecified osteoarthritis, unspecified site: Secondary | ICD-10-CM | POA: Diagnosis not present

## 2021-05-06 DIAGNOSIS — M4714 Other spondylosis with myelopathy, thoracic region: Secondary | ICD-10-CM | POA: Diagnosis not present

## 2021-05-06 DIAGNOSIS — J982 Interstitial emphysema: Secondary | ICD-10-CM | POA: Diagnosis not present

## 2021-05-06 DIAGNOSIS — F17210 Nicotine dependence, cigarettes, uncomplicated: Secondary | ICD-10-CM | POA: Diagnosis not present

## 2021-05-06 DIAGNOSIS — K319 Disease of stomach and duodenum, unspecified: Secondary | ICD-10-CM | POA: Diagnosis not present

## 2021-05-06 DIAGNOSIS — M1990 Unspecified osteoarthritis, unspecified site: Secondary | ICD-10-CM | POA: Diagnosis not present

## 2021-05-06 DIAGNOSIS — Z4789 Encounter for other orthopedic aftercare: Secondary | ICD-10-CM | POA: Diagnosis not present

## 2021-05-06 DIAGNOSIS — Z9181 History of falling: Secondary | ICD-10-CM | POA: Diagnosis not present

## 2021-05-06 DIAGNOSIS — D649 Anemia, unspecified: Secondary | ICD-10-CM | POA: Diagnosis not present

## 2021-05-06 DIAGNOSIS — G8222 Paraplegia, incomplete: Secondary | ICD-10-CM | POA: Diagnosis not present

## 2021-05-06 DIAGNOSIS — I700 Atherosclerosis of aorta: Secondary | ICD-10-CM | POA: Diagnosis not present

## 2021-05-06 DIAGNOSIS — R350 Frequency of micturition: Secondary | ICD-10-CM | POA: Diagnosis not present

## 2021-05-08 DIAGNOSIS — L89150 Pressure ulcer of sacral region, unstageable: Secondary | ICD-10-CM | POA: Diagnosis not present

## 2021-05-08 DIAGNOSIS — J982 Interstitial emphysema: Secondary | ICD-10-CM | POA: Diagnosis not present

## 2021-05-08 DIAGNOSIS — G8310 Monoplegia of lower limb affecting unspecified side: Secondary | ICD-10-CM | POA: Diagnosis not present

## 2021-05-11 DIAGNOSIS — I700 Atherosclerosis of aorta: Secondary | ICD-10-CM | POA: Diagnosis not present

## 2021-05-11 DIAGNOSIS — Z9181 History of falling: Secondary | ICD-10-CM | POA: Diagnosis not present

## 2021-05-11 DIAGNOSIS — M1990 Unspecified osteoarthritis, unspecified site: Secondary | ICD-10-CM | POA: Diagnosis not present

## 2021-05-11 DIAGNOSIS — K319 Disease of stomach and duodenum, unspecified: Secondary | ICD-10-CM | POA: Diagnosis not present

## 2021-05-11 DIAGNOSIS — G8222 Paraplegia, incomplete: Secondary | ICD-10-CM | POA: Diagnosis not present

## 2021-05-11 DIAGNOSIS — R350 Frequency of micturition: Secondary | ICD-10-CM | POA: Diagnosis not present

## 2021-05-11 DIAGNOSIS — F17210 Nicotine dependence, cigarettes, uncomplicated: Secondary | ICD-10-CM | POA: Diagnosis not present

## 2021-05-11 DIAGNOSIS — D649 Anemia, unspecified: Secondary | ICD-10-CM | POA: Diagnosis not present

## 2021-05-11 DIAGNOSIS — Z4789 Encounter for other orthopedic aftercare: Secondary | ICD-10-CM | POA: Diagnosis not present

## 2021-05-11 DIAGNOSIS — M4714 Other spondylosis with myelopathy, thoracic region: Secondary | ICD-10-CM | POA: Diagnosis not present

## 2021-05-11 DIAGNOSIS — J982 Interstitial emphysema: Secondary | ICD-10-CM | POA: Diagnosis not present

## 2021-05-12 DIAGNOSIS — Z4789 Encounter for other orthopedic aftercare: Secondary | ICD-10-CM | POA: Diagnosis not present

## 2021-05-12 DIAGNOSIS — F17210 Nicotine dependence, cigarettes, uncomplicated: Secondary | ICD-10-CM | POA: Diagnosis not present

## 2021-05-12 DIAGNOSIS — Z9181 History of falling: Secondary | ICD-10-CM | POA: Diagnosis not present

## 2021-05-12 DIAGNOSIS — D649 Anemia, unspecified: Secondary | ICD-10-CM | POA: Diagnosis not present

## 2021-05-12 DIAGNOSIS — G8222 Paraplegia, incomplete: Secondary | ICD-10-CM | POA: Diagnosis not present

## 2021-05-12 DIAGNOSIS — R350 Frequency of micturition: Secondary | ICD-10-CM | POA: Diagnosis not present

## 2021-05-12 DIAGNOSIS — M4714 Other spondylosis with myelopathy, thoracic region: Secondary | ICD-10-CM | POA: Diagnosis not present

## 2021-05-12 DIAGNOSIS — J982 Interstitial emphysema: Secondary | ICD-10-CM | POA: Diagnosis not present

## 2021-05-12 DIAGNOSIS — I700 Atherosclerosis of aorta: Secondary | ICD-10-CM | POA: Diagnosis not present

## 2021-05-12 DIAGNOSIS — K319 Disease of stomach and duodenum, unspecified: Secondary | ICD-10-CM | POA: Diagnosis not present

## 2021-05-12 DIAGNOSIS — M1990 Unspecified osteoarthritis, unspecified site: Secondary | ICD-10-CM | POA: Diagnosis not present

## 2021-05-16 DIAGNOSIS — G8222 Paraplegia, incomplete: Secondary | ICD-10-CM | POA: Diagnosis not present

## 2021-05-16 DIAGNOSIS — K319 Disease of stomach and duodenum, unspecified: Secondary | ICD-10-CM | POA: Diagnosis not present

## 2021-05-16 DIAGNOSIS — M4714 Other spondylosis with myelopathy, thoracic region: Secondary | ICD-10-CM | POA: Diagnosis not present

## 2021-05-16 DIAGNOSIS — I700 Atherosclerosis of aorta: Secondary | ICD-10-CM | POA: Diagnosis not present

## 2021-05-16 DIAGNOSIS — J982 Interstitial emphysema: Secondary | ICD-10-CM | POA: Diagnosis not present

## 2021-05-16 DIAGNOSIS — R350 Frequency of micturition: Secondary | ICD-10-CM | POA: Diagnosis not present

## 2021-05-16 DIAGNOSIS — F17210 Nicotine dependence, cigarettes, uncomplicated: Secondary | ICD-10-CM | POA: Diagnosis not present

## 2021-05-16 DIAGNOSIS — Z4789 Encounter for other orthopedic aftercare: Secondary | ICD-10-CM | POA: Diagnosis not present

## 2021-05-16 DIAGNOSIS — D649 Anemia, unspecified: Secondary | ICD-10-CM | POA: Diagnosis not present

## 2021-05-16 DIAGNOSIS — Z9181 History of falling: Secondary | ICD-10-CM | POA: Diagnosis not present

## 2021-05-16 DIAGNOSIS — M1990 Unspecified osteoarthritis, unspecified site: Secondary | ICD-10-CM | POA: Diagnosis not present

## 2021-05-17 DIAGNOSIS — R350 Frequency of micturition: Secondary | ICD-10-CM | POA: Diagnosis not present

## 2021-05-17 DIAGNOSIS — D649 Anemia, unspecified: Secondary | ICD-10-CM | POA: Diagnosis not present

## 2021-05-17 DIAGNOSIS — I700 Atherosclerosis of aorta: Secondary | ICD-10-CM | POA: Diagnosis not present

## 2021-05-17 DIAGNOSIS — Z9181 History of falling: Secondary | ICD-10-CM | POA: Diagnosis not present

## 2021-05-17 DIAGNOSIS — M1990 Unspecified osteoarthritis, unspecified site: Secondary | ICD-10-CM | POA: Diagnosis not present

## 2021-05-17 DIAGNOSIS — J982 Interstitial emphysema: Secondary | ICD-10-CM | POA: Diagnosis not present

## 2021-05-17 DIAGNOSIS — G8222 Paraplegia, incomplete: Secondary | ICD-10-CM | POA: Diagnosis not present

## 2021-05-17 DIAGNOSIS — K319 Disease of stomach and duodenum, unspecified: Secondary | ICD-10-CM | POA: Diagnosis not present

## 2021-05-17 DIAGNOSIS — F17210 Nicotine dependence, cigarettes, uncomplicated: Secondary | ICD-10-CM | POA: Diagnosis not present

## 2021-05-17 DIAGNOSIS — Z4789 Encounter for other orthopedic aftercare: Secondary | ICD-10-CM | POA: Diagnosis not present

## 2021-05-17 DIAGNOSIS — M4714 Other spondylosis with myelopathy, thoracic region: Secondary | ICD-10-CM | POA: Diagnosis not present

## 2021-05-19 DIAGNOSIS — M1990 Unspecified osteoarthritis, unspecified site: Secondary | ICD-10-CM | POA: Diagnosis not present

## 2021-05-19 DIAGNOSIS — G8222 Paraplegia, incomplete: Secondary | ICD-10-CM | POA: Diagnosis not present

## 2021-05-19 DIAGNOSIS — Z9181 History of falling: Secondary | ICD-10-CM | POA: Diagnosis not present

## 2021-05-19 DIAGNOSIS — F17210 Nicotine dependence, cigarettes, uncomplicated: Secondary | ICD-10-CM | POA: Diagnosis not present

## 2021-05-19 DIAGNOSIS — D649 Anemia, unspecified: Secondary | ICD-10-CM | POA: Diagnosis not present

## 2021-05-19 DIAGNOSIS — R350 Frequency of micturition: Secondary | ICD-10-CM | POA: Diagnosis not present

## 2021-05-19 DIAGNOSIS — M4714 Other spondylosis with myelopathy, thoracic region: Secondary | ICD-10-CM | POA: Diagnosis not present

## 2021-05-19 DIAGNOSIS — Z4789 Encounter for other orthopedic aftercare: Secondary | ICD-10-CM | POA: Diagnosis not present

## 2021-05-19 DIAGNOSIS — J982 Interstitial emphysema: Secondary | ICD-10-CM | POA: Diagnosis not present

## 2021-05-19 DIAGNOSIS — I700 Atherosclerosis of aorta: Secondary | ICD-10-CM | POA: Diagnosis not present

## 2021-05-19 DIAGNOSIS — K319 Disease of stomach and duodenum, unspecified: Secondary | ICD-10-CM | POA: Diagnosis not present

## 2021-05-23 DIAGNOSIS — D649 Anemia, unspecified: Secondary | ICD-10-CM | POA: Diagnosis not present

## 2021-05-23 DIAGNOSIS — G8222 Paraplegia, incomplete: Secondary | ICD-10-CM | POA: Diagnosis not present

## 2021-05-23 DIAGNOSIS — I700 Atherosclerosis of aorta: Secondary | ICD-10-CM | POA: Diagnosis not present

## 2021-05-23 DIAGNOSIS — K319 Disease of stomach and duodenum, unspecified: Secondary | ICD-10-CM | POA: Diagnosis not present

## 2021-05-23 DIAGNOSIS — J982 Interstitial emphysema: Secondary | ICD-10-CM | POA: Diagnosis not present

## 2021-05-23 DIAGNOSIS — M1990 Unspecified osteoarthritis, unspecified site: Secondary | ICD-10-CM | POA: Diagnosis not present

## 2021-05-23 DIAGNOSIS — R350 Frequency of micturition: Secondary | ICD-10-CM | POA: Diagnosis not present

## 2021-05-23 DIAGNOSIS — Z4789 Encounter for other orthopedic aftercare: Secondary | ICD-10-CM | POA: Diagnosis not present

## 2021-05-23 DIAGNOSIS — Z9181 History of falling: Secondary | ICD-10-CM | POA: Diagnosis not present

## 2021-05-23 DIAGNOSIS — M4714 Other spondylosis with myelopathy, thoracic region: Secondary | ICD-10-CM | POA: Diagnosis not present

## 2021-05-23 DIAGNOSIS — F17210 Nicotine dependence, cigarettes, uncomplicated: Secondary | ICD-10-CM | POA: Diagnosis not present

## 2021-05-26 DIAGNOSIS — K319 Disease of stomach and duodenum, unspecified: Secondary | ICD-10-CM | POA: Diagnosis not present

## 2021-05-26 DIAGNOSIS — M1990 Unspecified osteoarthritis, unspecified site: Secondary | ICD-10-CM | POA: Diagnosis not present

## 2021-05-26 DIAGNOSIS — F17210 Nicotine dependence, cigarettes, uncomplicated: Secondary | ICD-10-CM | POA: Diagnosis not present

## 2021-05-26 DIAGNOSIS — G8222 Paraplegia, incomplete: Secondary | ICD-10-CM | POA: Diagnosis not present

## 2021-05-26 DIAGNOSIS — J982 Interstitial emphysema: Secondary | ICD-10-CM | POA: Diagnosis not present

## 2021-05-26 DIAGNOSIS — Z4789 Encounter for other orthopedic aftercare: Secondary | ICD-10-CM | POA: Diagnosis not present

## 2021-05-26 DIAGNOSIS — I700 Atherosclerosis of aorta: Secondary | ICD-10-CM | POA: Diagnosis not present

## 2021-05-26 DIAGNOSIS — M4714 Other spondylosis with myelopathy, thoracic region: Secondary | ICD-10-CM | POA: Diagnosis not present

## 2021-05-26 DIAGNOSIS — D649 Anemia, unspecified: Secondary | ICD-10-CM | POA: Diagnosis not present

## 2021-05-26 DIAGNOSIS — R350 Frequency of micturition: Secondary | ICD-10-CM | POA: Diagnosis not present

## 2021-05-26 DIAGNOSIS — Z9181 History of falling: Secondary | ICD-10-CM | POA: Diagnosis not present

## 2021-05-27 DIAGNOSIS — D649 Anemia, unspecified: Secondary | ICD-10-CM | POA: Diagnosis not present

## 2021-05-27 DIAGNOSIS — I700 Atherosclerosis of aorta: Secondary | ICD-10-CM | POA: Diagnosis not present

## 2021-05-27 DIAGNOSIS — J982 Interstitial emphysema: Secondary | ICD-10-CM | POA: Diagnosis not present

## 2021-05-27 DIAGNOSIS — Z9181 History of falling: Secondary | ICD-10-CM | POA: Diagnosis not present

## 2021-05-27 DIAGNOSIS — G8222 Paraplegia, incomplete: Secondary | ICD-10-CM | POA: Diagnosis not present

## 2021-05-27 DIAGNOSIS — M1990 Unspecified osteoarthritis, unspecified site: Secondary | ICD-10-CM | POA: Diagnosis not present

## 2021-05-27 DIAGNOSIS — Z4789 Encounter for other orthopedic aftercare: Secondary | ICD-10-CM | POA: Diagnosis not present

## 2021-05-27 DIAGNOSIS — R350 Frequency of micturition: Secondary | ICD-10-CM | POA: Diagnosis not present

## 2021-05-27 DIAGNOSIS — M4714 Other spondylosis with myelopathy, thoracic region: Secondary | ICD-10-CM | POA: Diagnosis not present

## 2021-05-27 DIAGNOSIS — K319 Disease of stomach and duodenum, unspecified: Secondary | ICD-10-CM | POA: Diagnosis not present

## 2021-05-27 DIAGNOSIS — F17210 Nicotine dependence, cigarettes, uncomplicated: Secondary | ICD-10-CM | POA: Diagnosis not present

## 2021-05-31 DIAGNOSIS — R350 Frequency of micturition: Secondary | ICD-10-CM | POA: Diagnosis not present

## 2021-05-31 DIAGNOSIS — F17210 Nicotine dependence, cigarettes, uncomplicated: Secondary | ICD-10-CM | POA: Diagnosis not present

## 2021-05-31 DIAGNOSIS — K319 Disease of stomach and duodenum, unspecified: Secondary | ICD-10-CM | POA: Diagnosis not present

## 2021-05-31 DIAGNOSIS — Z9181 History of falling: Secondary | ICD-10-CM | POA: Diagnosis not present

## 2021-05-31 DIAGNOSIS — D649 Anemia, unspecified: Secondary | ICD-10-CM | POA: Diagnosis not present

## 2021-05-31 DIAGNOSIS — I700 Atherosclerosis of aorta: Secondary | ICD-10-CM | POA: Diagnosis not present

## 2021-05-31 DIAGNOSIS — M4714 Other spondylosis with myelopathy, thoracic region: Secondary | ICD-10-CM | POA: Diagnosis not present

## 2021-05-31 DIAGNOSIS — Z4789 Encounter for other orthopedic aftercare: Secondary | ICD-10-CM | POA: Diagnosis not present

## 2021-05-31 DIAGNOSIS — M1990 Unspecified osteoarthritis, unspecified site: Secondary | ICD-10-CM | POA: Diagnosis not present

## 2021-05-31 DIAGNOSIS — G8222 Paraplegia, incomplete: Secondary | ICD-10-CM | POA: Diagnosis not present

## 2021-05-31 DIAGNOSIS — J982 Interstitial emphysema: Secondary | ICD-10-CM | POA: Diagnosis not present

## 2021-06-01 DIAGNOSIS — Z4789 Encounter for other orthopedic aftercare: Secondary | ICD-10-CM | POA: Diagnosis not present

## 2021-06-01 DIAGNOSIS — M4714 Other spondylosis with myelopathy, thoracic region: Secondary | ICD-10-CM | POA: Diagnosis not present

## 2021-06-01 DIAGNOSIS — F17210 Nicotine dependence, cigarettes, uncomplicated: Secondary | ICD-10-CM | POA: Diagnosis not present

## 2021-06-01 DIAGNOSIS — R350 Frequency of micturition: Secondary | ICD-10-CM | POA: Diagnosis not present

## 2021-06-01 DIAGNOSIS — I700 Atherosclerosis of aorta: Secondary | ICD-10-CM | POA: Diagnosis not present

## 2021-06-01 DIAGNOSIS — D649 Anemia, unspecified: Secondary | ICD-10-CM | POA: Diagnosis not present

## 2021-06-01 DIAGNOSIS — M1990 Unspecified osteoarthritis, unspecified site: Secondary | ICD-10-CM | POA: Diagnosis not present

## 2021-06-01 DIAGNOSIS — Z9181 History of falling: Secondary | ICD-10-CM | POA: Diagnosis not present

## 2021-06-01 DIAGNOSIS — K319 Disease of stomach and duodenum, unspecified: Secondary | ICD-10-CM | POA: Diagnosis not present

## 2021-06-01 DIAGNOSIS — J982 Interstitial emphysema: Secondary | ICD-10-CM | POA: Diagnosis not present

## 2021-06-01 DIAGNOSIS — G8222 Paraplegia, incomplete: Secondary | ICD-10-CM | POA: Diagnosis not present

## 2021-06-02 DIAGNOSIS — D649 Anemia, unspecified: Secondary | ICD-10-CM | POA: Diagnosis not present

## 2021-06-02 DIAGNOSIS — M4714 Other spondylosis with myelopathy, thoracic region: Secondary | ICD-10-CM | POA: Diagnosis not present

## 2021-06-02 DIAGNOSIS — Z9181 History of falling: Secondary | ICD-10-CM | POA: Diagnosis not present

## 2021-06-02 DIAGNOSIS — Z4789 Encounter for other orthopedic aftercare: Secondary | ICD-10-CM | POA: Diagnosis not present

## 2021-06-02 DIAGNOSIS — F17210 Nicotine dependence, cigarettes, uncomplicated: Secondary | ICD-10-CM | POA: Diagnosis not present

## 2021-06-02 DIAGNOSIS — J982 Interstitial emphysema: Secondary | ICD-10-CM | POA: Diagnosis not present

## 2021-06-02 DIAGNOSIS — I700 Atherosclerosis of aorta: Secondary | ICD-10-CM | POA: Diagnosis not present

## 2021-06-02 DIAGNOSIS — R350 Frequency of micturition: Secondary | ICD-10-CM | POA: Diagnosis not present

## 2021-06-02 DIAGNOSIS — G8222 Paraplegia, incomplete: Secondary | ICD-10-CM | POA: Diagnosis not present

## 2021-06-02 DIAGNOSIS — M1990 Unspecified osteoarthritis, unspecified site: Secondary | ICD-10-CM | POA: Diagnosis not present

## 2021-06-02 DIAGNOSIS — K319 Disease of stomach and duodenum, unspecified: Secondary | ICD-10-CM | POA: Diagnosis not present

## 2021-06-05 DIAGNOSIS — J982 Interstitial emphysema: Secondary | ICD-10-CM | POA: Diagnosis not present

## 2021-06-05 DIAGNOSIS — K319 Disease of stomach and duodenum, unspecified: Secondary | ICD-10-CM | POA: Diagnosis not present

## 2021-06-05 DIAGNOSIS — M1990 Unspecified osteoarthritis, unspecified site: Secondary | ICD-10-CM | POA: Diagnosis not present

## 2021-06-05 DIAGNOSIS — M4714 Other spondylosis with myelopathy, thoracic region: Secondary | ICD-10-CM | POA: Diagnosis not present

## 2021-06-05 DIAGNOSIS — R350 Frequency of micturition: Secondary | ICD-10-CM | POA: Diagnosis not present

## 2021-06-05 DIAGNOSIS — F17210 Nicotine dependence, cigarettes, uncomplicated: Secondary | ICD-10-CM | POA: Diagnosis not present

## 2021-06-05 DIAGNOSIS — G8222 Paraplegia, incomplete: Secondary | ICD-10-CM | POA: Diagnosis not present

## 2021-06-05 DIAGNOSIS — Z4789 Encounter for other orthopedic aftercare: Secondary | ICD-10-CM | POA: Diagnosis not present

## 2021-06-05 DIAGNOSIS — I700 Atherosclerosis of aorta: Secondary | ICD-10-CM | POA: Diagnosis not present

## 2021-06-05 DIAGNOSIS — D649 Anemia, unspecified: Secondary | ICD-10-CM | POA: Diagnosis not present

## 2021-06-05 DIAGNOSIS — Z9181 History of falling: Secondary | ICD-10-CM | POA: Diagnosis not present

## 2021-06-06 DIAGNOSIS — M4714 Other spondylosis with myelopathy, thoracic region: Secondary | ICD-10-CM | POA: Diagnosis not present

## 2021-06-06 DIAGNOSIS — M6281 Muscle weakness (generalized): Secondary | ICD-10-CM | POA: Diagnosis not present

## 2021-06-06 DIAGNOSIS — Z23 Encounter for immunization: Secondary | ICD-10-CM | POA: Diagnosis not present

## 2021-06-06 DIAGNOSIS — R350 Frequency of micturition: Secondary | ICD-10-CM | POA: Diagnosis not present

## 2021-06-06 DIAGNOSIS — J982 Interstitial emphysema: Secondary | ICD-10-CM | POA: Diagnosis not present

## 2021-06-06 DIAGNOSIS — Z87891 Personal history of nicotine dependence: Secondary | ICD-10-CM | POA: Diagnosis not present

## 2021-06-06 DIAGNOSIS — F17200 Nicotine dependence, unspecified, uncomplicated: Secondary | ICD-10-CM | POA: Diagnosis not present

## 2021-06-06 DIAGNOSIS — I700 Atherosclerosis of aorta: Secondary | ICD-10-CM | POA: Diagnosis not present

## 2021-06-06 DIAGNOSIS — D649 Anemia, unspecified: Secondary | ICD-10-CM | POA: Diagnosis not present

## 2021-06-06 DIAGNOSIS — M4804 Spinal stenosis, thoracic region: Secondary | ICD-10-CM | POA: Diagnosis not present

## 2021-06-06 DIAGNOSIS — G8310 Monoplegia of lower limb affecting unspecified side: Secondary | ICD-10-CM | POA: Diagnosis not present

## 2021-06-07 DIAGNOSIS — M4714 Other spondylosis with myelopathy, thoracic region: Secondary | ICD-10-CM | POA: Diagnosis not present

## 2021-06-07 DIAGNOSIS — Z9181 History of falling: Secondary | ICD-10-CM | POA: Diagnosis not present

## 2021-06-07 DIAGNOSIS — R350 Frequency of micturition: Secondary | ICD-10-CM | POA: Diagnosis not present

## 2021-06-07 DIAGNOSIS — Z4789 Encounter for other orthopedic aftercare: Secondary | ICD-10-CM | POA: Diagnosis not present

## 2021-06-07 DIAGNOSIS — D649 Anemia, unspecified: Secondary | ICD-10-CM | POA: Diagnosis not present

## 2021-06-07 DIAGNOSIS — M1990 Unspecified osteoarthritis, unspecified site: Secondary | ICD-10-CM | POA: Diagnosis not present

## 2021-06-07 DIAGNOSIS — F17210 Nicotine dependence, cigarettes, uncomplicated: Secondary | ICD-10-CM | POA: Diagnosis not present

## 2021-06-07 DIAGNOSIS — J982 Interstitial emphysema: Secondary | ICD-10-CM | POA: Diagnosis not present

## 2021-06-07 DIAGNOSIS — I700 Atherosclerosis of aorta: Secondary | ICD-10-CM | POA: Diagnosis not present

## 2021-06-07 DIAGNOSIS — K319 Disease of stomach and duodenum, unspecified: Secondary | ICD-10-CM | POA: Diagnosis not present

## 2021-06-07 DIAGNOSIS — G8222 Paraplegia, incomplete: Secondary | ICD-10-CM | POA: Diagnosis not present

## 2021-06-08 DIAGNOSIS — G8310 Monoplegia of lower limb affecting unspecified side: Secondary | ICD-10-CM | POA: Diagnosis not present

## 2021-06-08 DIAGNOSIS — J982 Interstitial emphysema: Secondary | ICD-10-CM | POA: Diagnosis not present

## 2021-06-08 DIAGNOSIS — L89150 Pressure ulcer of sacral region, unstageable: Secondary | ICD-10-CM | POA: Diagnosis not present

## 2021-06-09 DIAGNOSIS — Z9181 History of falling: Secondary | ICD-10-CM | POA: Diagnosis not present

## 2021-06-09 DIAGNOSIS — I700 Atherosclerosis of aorta: Secondary | ICD-10-CM | POA: Diagnosis not present

## 2021-06-09 DIAGNOSIS — G8222 Paraplegia, incomplete: Secondary | ICD-10-CM | POA: Diagnosis not present

## 2021-06-09 DIAGNOSIS — D649 Anemia, unspecified: Secondary | ICD-10-CM | POA: Diagnosis not present

## 2021-06-09 DIAGNOSIS — Z4789 Encounter for other orthopedic aftercare: Secondary | ICD-10-CM | POA: Diagnosis not present

## 2021-06-09 DIAGNOSIS — J982 Interstitial emphysema: Secondary | ICD-10-CM | POA: Diagnosis not present

## 2021-06-09 DIAGNOSIS — F17210 Nicotine dependence, cigarettes, uncomplicated: Secondary | ICD-10-CM | POA: Diagnosis not present

## 2021-06-09 DIAGNOSIS — R350 Frequency of micturition: Secondary | ICD-10-CM | POA: Diagnosis not present

## 2021-06-09 DIAGNOSIS — K319 Disease of stomach and duodenum, unspecified: Secondary | ICD-10-CM | POA: Diagnosis not present

## 2021-06-09 DIAGNOSIS — M1990 Unspecified osteoarthritis, unspecified site: Secondary | ICD-10-CM | POA: Diagnosis not present

## 2021-06-09 DIAGNOSIS — M4714 Other spondylosis with myelopathy, thoracic region: Secondary | ICD-10-CM | POA: Diagnosis not present

## 2021-06-14 DIAGNOSIS — J982 Interstitial emphysema: Secondary | ICD-10-CM | POA: Diagnosis not present

## 2021-06-14 DIAGNOSIS — Z9181 History of falling: Secondary | ICD-10-CM | POA: Diagnosis not present

## 2021-06-14 DIAGNOSIS — R350 Frequency of micturition: Secondary | ICD-10-CM | POA: Diagnosis not present

## 2021-06-14 DIAGNOSIS — I700 Atherosclerosis of aorta: Secondary | ICD-10-CM | POA: Diagnosis not present

## 2021-06-14 DIAGNOSIS — Z4789 Encounter for other orthopedic aftercare: Secondary | ICD-10-CM | POA: Diagnosis not present

## 2021-06-14 DIAGNOSIS — M4714 Other spondylosis with myelopathy, thoracic region: Secondary | ICD-10-CM | POA: Diagnosis not present

## 2021-06-14 DIAGNOSIS — K319 Disease of stomach and duodenum, unspecified: Secondary | ICD-10-CM | POA: Diagnosis not present

## 2021-06-14 DIAGNOSIS — G8222 Paraplegia, incomplete: Secondary | ICD-10-CM | POA: Diagnosis not present

## 2021-06-14 DIAGNOSIS — D649 Anemia, unspecified: Secondary | ICD-10-CM | POA: Diagnosis not present

## 2021-06-14 DIAGNOSIS — F17210 Nicotine dependence, cigarettes, uncomplicated: Secondary | ICD-10-CM | POA: Diagnosis not present

## 2021-06-14 DIAGNOSIS — M1990 Unspecified osteoarthritis, unspecified site: Secondary | ICD-10-CM | POA: Diagnosis not present

## 2021-06-15 DIAGNOSIS — M1990 Unspecified osteoarthritis, unspecified site: Secondary | ICD-10-CM | POA: Diagnosis not present

## 2021-06-15 DIAGNOSIS — Z9181 History of falling: Secondary | ICD-10-CM | POA: Diagnosis not present

## 2021-06-15 DIAGNOSIS — I700 Atherosclerosis of aorta: Secondary | ICD-10-CM | POA: Diagnosis not present

## 2021-06-15 DIAGNOSIS — M4714 Other spondylosis with myelopathy, thoracic region: Secondary | ICD-10-CM | POA: Diagnosis not present

## 2021-06-15 DIAGNOSIS — K319 Disease of stomach and duodenum, unspecified: Secondary | ICD-10-CM | POA: Diagnosis not present

## 2021-06-15 DIAGNOSIS — J982 Interstitial emphysema: Secondary | ICD-10-CM | POA: Diagnosis not present

## 2021-06-15 DIAGNOSIS — Z4789 Encounter for other orthopedic aftercare: Secondary | ICD-10-CM | POA: Diagnosis not present

## 2021-06-15 DIAGNOSIS — F17210 Nicotine dependence, cigarettes, uncomplicated: Secondary | ICD-10-CM | POA: Diagnosis not present

## 2021-06-15 DIAGNOSIS — G8222 Paraplegia, incomplete: Secondary | ICD-10-CM | POA: Diagnosis not present

## 2021-06-15 DIAGNOSIS — R350 Frequency of micturition: Secondary | ICD-10-CM | POA: Diagnosis not present

## 2021-06-15 DIAGNOSIS — D649 Anemia, unspecified: Secondary | ICD-10-CM | POA: Diagnosis not present

## 2021-06-17 DIAGNOSIS — G8222 Paraplegia, incomplete: Secondary | ICD-10-CM | POA: Diagnosis not present

## 2021-06-17 DIAGNOSIS — R350 Frequency of micturition: Secondary | ICD-10-CM | POA: Diagnosis not present

## 2021-06-17 DIAGNOSIS — K319 Disease of stomach and duodenum, unspecified: Secondary | ICD-10-CM | POA: Diagnosis not present

## 2021-06-17 DIAGNOSIS — F17210 Nicotine dependence, cigarettes, uncomplicated: Secondary | ICD-10-CM | POA: Diagnosis not present

## 2021-06-17 DIAGNOSIS — Z9181 History of falling: Secondary | ICD-10-CM | POA: Diagnosis not present

## 2021-06-17 DIAGNOSIS — I700 Atherosclerosis of aorta: Secondary | ICD-10-CM | POA: Diagnosis not present

## 2021-06-17 DIAGNOSIS — M1990 Unspecified osteoarthritis, unspecified site: Secondary | ICD-10-CM | POA: Diagnosis not present

## 2021-06-17 DIAGNOSIS — J982 Interstitial emphysema: Secondary | ICD-10-CM | POA: Diagnosis not present

## 2021-06-17 DIAGNOSIS — M4714 Other spondylosis with myelopathy, thoracic region: Secondary | ICD-10-CM | POA: Diagnosis not present

## 2021-06-17 DIAGNOSIS — Z4789 Encounter for other orthopedic aftercare: Secondary | ICD-10-CM | POA: Diagnosis not present

## 2021-06-17 DIAGNOSIS — D649 Anemia, unspecified: Secondary | ICD-10-CM | POA: Diagnosis not present

## 2021-06-22 DIAGNOSIS — M1990 Unspecified osteoarthritis, unspecified site: Secondary | ICD-10-CM | POA: Diagnosis not present

## 2021-06-22 DIAGNOSIS — I700 Atherosclerosis of aorta: Secondary | ICD-10-CM | POA: Diagnosis not present

## 2021-06-22 DIAGNOSIS — D649 Anemia, unspecified: Secondary | ICD-10-CM | POA: Diagnosis not present

## 2021-06-22 DIAGNOSIS — J982 Interstitial emphysema: Secondary | ICD-10-CM | POA: Diagnosis not present

## 2021-06-22 DIAGNOSIS — Z4789 Encounter for other orthopedic aftercare: Secondary | ICD-10-CM | POA: Diagnosis not present

## 2021-06-22 DIAGNOSIS — K319 Disease of stomach and duodenum, unspecified: Secondary | ICD-10-CM | POA: Diagnosis not present

## 2021-06-22 DIAGNOSIS — M4714 Other spondylosis with myelopathy, thoracic region: Secondary | ICD-10-CM | POA: Diagnosis not present

## 2021-06-22 DIAGNOSIS — F17210 Nicotine dependence, cigarettes, uncomplicated: Secondary | ICD-10-CM | POA: Diagnosis not present

## 2021-06-22 DIAGNOSIS — Z9181 History of falling: Secondary | ICD-10-CM | POA: Diagnosis not present

## 2021-06-22 DIAGNOSIS — G8222 Paraplegia, incomplete: Secondary | ICD-10-CM | POA: Diagnosis not present

## 2021-06-22 DIAGNOSIS — R350 Frequency of micturition: Secondary | ICD-10-CM | POA: Diagnosis not present

## 2021-06-23 DIAGNOSIS — R350 Frequency of micturition: Secondary | ICD-10-CM | POA: Diagnosis not present

## 2021-06-23 DIAGNOSIS — M1990 Unspecified osteoarthritis, unspecified site: Secondary | ICD-10-CM | POA: Diagnosis not present

## 2021-06-23 DIAGNOSIS — Z4789 Encounter for other orthopedic aftercare: Secondary | ICD-10-CM | POA: Diagnosis not present

## 2021-06-23 DIAGNOSIS — Z9181 History of falling: Secondary | ICD-10-CM | POA: Diagnosis not present

## 2021-06-23 DIAGNOSIS — J982 Interstitial emphysema: Secondary | ICD-10-CM | POA: Diagnosis not present

## 2021-06-23 DIAGNOSIS — M4714 Other spondylosis with myelopathy, thoracic region: Secondary | ICD-10-CM | POA: Diagnosis not present

## 2021-06-23 DIAGNOSIS — F17210 Nicotine dependence, cigarettes, uncomplicated: Secondary | ICD-10-CM | POA: Diagnosis not present

## 2021-06-23 DIAGNOSIS — D649 Anemia, unspecified: Secondary | ICD-10-CM | POA: Diagnosis not present

## 2021-06-23 DIAGNOSIS — I700 Atherosclerosis of aorta: Secondary | ICD-10-CM | POA: Diagnosis not present

## 2021-06-23 DIAGNOSIS — G8222 Paraplegia, incomplete: Secondary | ICD-10-CM | POA: Diagnosis not present

## 2021-06-23 DIAGNOSIS — K319 Disease of stomach and duodenum, unspecified: Secondary | ICD-10-CM | POA: Diagnosis not present

## 2021-06-24 DIAGNOSIS — Z87891 Personal history of nicotine dependence: Secondary | ICD-10-CM | POA: Diagnosis not present

## 2021-06-24 DIAGNOSIS — F17210 Nicotine dependence, cigarettes, uncomplicated: Secondary | ICD-10-CM | POA: Diagnosis not present

## 2021-06-27 DIAGNOSIS — M1990 Unspecified osteoarthritis, unspecified site: Secondary | ICD-10-CM | POA: Diagnosis not present

## 2021-06-27 DIAGNOSIS — Z4789 Encounter for other orthopedic aftercare: Secondary | ICD-10-CM | POA: Diagnosis not present

## 2021-06-27 DIAGNOSIS — D649 Anemia, unspecified: Secondary | ICD-10-CM | POA: Diagnosis not present

## 2021-06-27 DIAGNOSIS — Z9181 History of falling: Secondary | ICD-10-CM | POA: Diagnosis not present

## 2021-06-27 DIAGNOSIS — F17210 Nicotine dependence, cigarettes, uncomplicated: Secondary | ICD-10-CM | POA: Diagnosis not present

## 2021-06-27 DIAGNOSIS — G8222 Paraplegia, incomplete: Secondary | ICD-10-CM | POA: Diagnosis not present

## 2021-06-27 DIAGNOSIS — R350 Frequency of micturition: Secondary | ICD-10-CM | POA: Diagnosis not present

## 2021-06-27 DIAGNOSIS — K319 Disease of stomach and duodenum, unspecified: Secondary | ICD-10-CM | POA: Diagnosis not present

## 2021-06-27 DIAGNOSIS — M4714 Other spondylosis with myelopathy, thoracic region: Secondary | ICD-10-CM | POA: Diagnosis not present

## 2021-06-27 DIAGNOSIS — I700 Atherosclerosis of aorta: Secondary | ICD-10-CM | POA: Diagnosis not present

## 2021-06-27 DIAGNOSIS — J982 Interstitial emphysema: Secondary | ICD-10-CM | POA: Diagnosis not present

## 2021-07-01 DIAGNOSIS — F17210 Nicotine dependence, cigarettes, uncomplicated: Secondary | ICD-10-CM | POA: Diagnosis not present

## 2021-07-01 DIAGNOSIS — G8222 Paraplegia, incomplete: Secondary | ICD-10-CM | POA: Diagnosis not present

## 2021-07-01 DIAGNOSIS — J982 Interstitial emphysema: Secondary | ICD-10-CM | POA: Diagnosis not present

## 2021-07-01 DIAGNOSIS — Z4789 Encounter for other orthopedic aftercare: Secondary | ICD-10-CM | POA: Diagnosis not present

## 2021-07-01 DIAGNOSIS — M4714 Other spondylosis with myelopathy, thoracic region: Secondary | ICD-10-CM | POA: Diagnosis not present

## 2021-07-01 DIAGNOSIS — K319 Disease of stomach and duodenum, unspecified: Secondary | ICD-10-CM | POA: Diagnosis not present

## 2021-07-01 DIAGNOSIS — I700 Atherosclerosis of aorta: Secondary | ICD-10-CM | POA: Diagnosis not present

## 2021-07-01 DIAGNOSIS — D649 Anemia, unspecified: Secondary | ICD-10-CM | POA: Diagnosis not present

## 2021-07-01 DIAGNOSIS — Z9181 History of falling: Secondary | ICD-10-CM | POA: Diagnosis not present

## 2021-07-01 DIAGNOSIS — M1990 Unspecified osteoarthritis, unspecified site: Secondary | ICD-10-CM | POA: Diagnosis not present

## 2021-07-01 DIAGNOSIS — R350 Frequency of micturition: Secondary | ICD-10-CM | POA: Diagnosis not present

## 2021-07-04 DIAGNOSIS — K319 Disease of stomach and duodenum, unspecified: Secondary | ICD-10-CM | POA: Diagnosis not present

## 2021-07-04 DIAGNOSIS — M4714 Other spondylosis with myelopathy, thoracic region: Secondary | ICD-10-CM | POA: Diagnosis not present

## 2021-07-04 DIAGNOSIS — J982 Interstitial emphysema: Secondary | ICD-10-CM | POA: Diagnosis not present

## 2021-07-04 DIAGNOSIS — F17210 Nicotine dependence, cigarettes, uncomplicated: Secondary | ICD-10-CM | POA: Diagnosis not present

## 2021-07-04 DIAGNOSIS — R350 Frequency of micturition: Secondary | ICD-10-CM | POA: Diagnosis not present

## 2021-07-04 DIAGNOSIS — Z9181 History of falling: Secondary | ICD-10-CM | POA: Diagnosis not present

## 2021-07-04 DIAGNOSIS — Z4789 Encounter for other orthopedic aftercare: Secondary | ICD-10-CM | POA: Diagnosis not present

## 2021-07-04 DIAGNOSIS — I700 Atherosclerosis of aorta: Secondary | ICD-10-CM | POA: Diagnosis not present

## 2021-07-04 DIAGNOSIS — D649 Anemia, unspecified: Secondary | ICD-10-CM | POA: Diagnosis not present

## 2021-07-04 DIAGNOSIS — M1990 Unspecified osteoarthritis, unspecified site: Secondary | ICD-10-CM | POA: Diagnosis not present

## 2021-07-04 DIAGNOSIS — G8222 Paraplegia, incomplete: Secondary | ICD-10-CM | POA: Diagnosis not present

## 2021-07-06 DIAGNOSIS — K319 Disease of stomach and duodenum, unspecified: Secondary | ICD-10-CM | POA: Diagnosis not present

## 2021-07-06 DIAGNOSIS — I700 Atherosclerosis of aorta: Secondary | ICD-10-CM | POA: Diagnosis not present

## 2021-07-06 DIAGNOSIS — G8222 Paraplegia, incomplete: Secondary | ICD-10-CM | POA: Diagnosis not present

## 2021-07-06 DIAGNOSIS — R350 Frequency of micturition: Secondary | ICD-10-CM | POA: Diagnosis not present

## 2021-07-06 DIAGNOSIS — F17210 Nicotine dependence, cigarettes, uncomplicated: Secondary | ICD-10-CM | POA: Diagnosis not present

## 2021-07-06 DIAGNOSIS — J982 Interstitial emphysema: Secondary | ICD-10-CM | POA: Diagnosis not present

## 2021-07-06 DIAGNOSIS — M4714 Other spondylosis with myelopathy, thoracic region: Secondary | ICD-10-CM | POA: Diagnosis not present

## 2021-07-06 DIAGNOSIS — D649 Anemia, unspecified: Secondary | ICD-10-CM | POA: Diagnosis not present

## 2021-07-06 DIAGNOSIS — Z9181 History of falling: Secondary | ICD-10-CM | POA: Diagnosis not present

## 2021-07-06 DIAGNOSIS — Z4789 Encounter for other orthopedic aftercare: Secondary | ICD-10-CM | POA: Diagnosis not present

## 2021-07-06 DIAGNOSIS — M1990 Unspecified osteoarthritis, unspecified site: Secondary | ICD-10-CM | POA: Diagnosis not present

## 2021-07-07 DIAGNOSIS — J982 Interstitial emphysema: Secondary | ICD-10-CM | POA: Diagnosis not present

## 2021-07-07 DIAGNOSIS — Z4789 Encounter for other orthopedic aftercare: Secondary | ICD-10-CM | POA: Diagnosis not present

## 2021-07-07 DIAGNOSIS — R350 Frequency of micturition: Secondary | ICD-10-CM | POA: Diagnosis not present

## 2021-07-07 DIAGNOSIS — G8222 Paraplegia, incomplete: Secondary | ICD-10-CM | POA: Diagnosis not present

## 2021-07-07 DIAGNOSIS — M4714 Other spondylosis with myelopathy, thoracic region: Secondary | ICD-10-CM | POA: Diagnosis not present

## 2021-07-07 DIAGNOSIS — M1990 Unspecified osteoarthritis, unspecified site: Secondary | ICD-10-CM | POA: Diagnosis not present

## 2021-07-07 DIAGNOSIS — K319 Disease of stomach and duodenum, unspecified: Secondary | ICD-10-CM | POA: Diagnosis not present

## 2021-07-07 DIAGNOSIS — D649 Anemia, unspecified: Secondary | ICD-10-CM | POA: Diagnosis not present

## 2021-07-07 DIAGNOSIS — F17210 Nicotine dependence, cigarettes, uncomplicated: Secondary | ICD-10-CM | POA: Diagnosis not present

## 2021-07-07 DIAGNOSIS — Z9181 History of falling: Secondary | ICD-10-CM | POA: Diagnosis not present

## 2021-07-07 DIAGNOSIS — I700 Atherosclerosis of aorta: Secondary | ICD-10-CM | POA: Diagnosis not present

## 2021-07-08 DIAGNOSIS — J982 Interstitial emphysema: Secondary | ICD-10-CM | POA: Diagnosis not present

## 2021-07-08 DIAGNOSIS — G8310 Monoplegia of lower limb affecting unspecified side: Secondary | ICD-10-CM | POA: Diagnosis not present

## 2021-07-08 DIAGNOSIS — L89150 Pressure ulcer of sacral region, unstageable: Secondary | ICD-10-CM | POA: Diagnosis not present

## 2021-07-12 DIAGNOSIS — D649 Anemia, unspecified: Secondary | ICD-10-CM | POA: Diagnosis not present

## 2021-07-12 DIAGNOSIS — F17210 Nicotine dependence, cigarettes, uncomplicated: Secondary | ICD-10-CM | POA: Diagnosis not present

## 2021-07-12 DIAGNOSIS — K319 Disease of stomach and duodenum, unspecified: Secondary | ICD-10-CM | POA: Diagnosis not present

## 2021-07-12 DIAGNOSIS — Z9181 History of falling: Secondary | ICD-10-CM | POA: Diagnosis not present

## 2021-07-12 DIAGNOSIS — M1990 Unspecified osteoarthritis, unspecified site: Secondary | ICD-10-CM | POA: Diagnosis not present

## 2021-07-12 DIAGNOSIS — Z4789 Encounter for other orthopedic aftercare: Secondary | ICD-10-CM | POA: Diagnosis not present

## 2021-07-12 DIAGNOSIS — J982 Interstitial emphysema: Secondary | ICD-10-CM | POA: Diagnosis not present

## 2021-07-12 DIAGNOSIS — G8222 Paraplegia, incomplete: Secondary | ICD-10-CM | POA: Diagnosis not present

## 2021-07-12 DIAGNOSIS — R350 Frequency of micturition: Secondary | ICD-10-CM | POA: Diagnosis not present

## 2021-07-12 DIAGNOSIS — M4714 Other spondylosis with myelopathy, thoracic region: Secondary | ICD-10-CM | POA: Diagnosis not present

## 2021-07-12 DIAGNOSIS — I700 Atherosclerosis of aorta: Secondary | ICD-10-CM | POA: Diagnosis not present

## 2021-07-13 DIAGNOSIS — R350 Frequency of micturition: Secondary | ICD-10-CM | POA: Diagnosis not present

## 2021-07-13 DIAGNOSIS — D649 Anemia, unspecified: Secondary | ICD-10-CM | POA: Diagnosis not present

## 2021-07-13 DIAGNOSIS — G8222 Paraplegia, incomplete: Secondary | ICD-10-CM | POA: Diagnosis not present

## 2021-07-13 DIAGNOSIS — M1990 Unspecified osteoarthritis, unspecified site: Secondary | ICD-10-CM | POA: Diagnosis not present

## 2021-07-13 DIAGNOSIS — I700 Atherosclerosis of aorta: Secondary | ICD-10-CM | POA: Diagnosis not present

## 2021-07-13 DIAGNOSIS — M4714 Other spondylosis with myelopathy, thoracic region: Secondary | ICD-10-CM | POA: Diagnosis not present

## 2021-07-13 DIAGNOSIS — K319 Disease of stomach and duodenum, unspecified: Secondary | ICD-10-CM | POA: Diagnosis not present

## 2021-07-13 DIAGNOSIS — F17210 Nicotine dependence, cigarettes, uncomplicated: Secondary | ICD-10-CM | POA: Diagnosis not present

## 2021-07-13 DIAGNOSIS — J982 Interstitial emphysema: Secondary | ICD-10-CM | POA: Diagnosis not present

## 2021-07-13 DIAGNOSIS — Z9181 History of falling: Secondary | ICD-10-CM | POA: Diagnosis not present

## 2021-07-13 DIAGNOSIS — Z4789 Encounter for other orthopedic aftercare: Secondary | ICD-10-CM | POA: Diagnosis not present

## 2021-07-15 DIAGNOSIS — M4714 Other spondylosis with myelopathy, thoracic region: Secondary | ICD-10-CM | POA: Diagnosis not present

## 2021-07-15 DIAGNOSIS — J982 Interstitial emphysema: Secondary | ICD-10-CM | POA: Diagnosis not present

## 2021-07-15 DIAGNOSIS — K319 Disease of stomach and duodenum, unspecified: Secondary | ICD-10-CM | POA: Diagnosis not present

## 2021-07-15 DIAGNOSIS — R350 Frequency of micturition: Secondary | ICD-10-CM | POA: Diagnosis not present

## 2021-07-15 DIAGNOSIS — Z9181 History of falling: Secondary | ICD-10-CM | POA: Diagnosis not present

## 2021-07-15 DIAGNOSIS — I700 Atherosclerosis of aorta: Secondary | ICD-10-CM | POA: Diagnosis not present

## 2021-07-15 DIAGNOSIS — F17210 Nicotine dependence, cigarettes, uncomplicated: Secondary | ICD-10-CM | POA: Diagnosis not present

## 2021-07-15 DIAGNOSIS — G8222 Paraplegia, incomplete: Secondary | ICD-10-CM | POA: Diagnosis not present

## 2021-07-15 DIAGNOSIS — Z4789 Encounter for other orthopedic aftercare: Secondary | ICD-10-CM | POA: Diagnosis not present

## 2021-07-15 DIAGNOSIS — M1990 Unspecified osteoarthritis, unspecified site: Secondary | ICD-10-CM | POA: Diagnosis not present

## 2021-07-15 DIAGNOSIS — D649 Anemia, unspecified: Secondary | ICD-10-CM | POA: Diagnosis not present

## 2021-07-19 DIAGNOSIS — Z9181 History of falling: Secondary | ICD-10-CM | POA: Diagnosis not present

## 2021-07-19 DIAGNOSIS — K319 Disease of stomach and duodenum, unspecified: Secondary | ICD-10-CM | POA: Diagnosis not present

## 2021-07-19 DIAGNOSIS — M1990 Unspecified osteoarthritis, unspecified site: Secondary | ICD-10-CM | POA: Diagnosis not present

## 2021-07-19 DIAGNOSIS — R350 Frequency of micturition: Secondary | ICD-10-CM | POA: Diagnosis not present

## 2021-07-19 DIAGNOSIS — M4714 Other spondylosis with myelopathy, thoracic region: Secondary | ICD-10-CM | POA: Diagnosis not present

## 2021-07-19 DIAGNOSIS — I700 Atherosclerosis of aorta: Secondary | ICD-10-CM | POA: Diagnosis not present

## 2021-07-19 DIAGNOSIS — D649 Anemia, unspecified: Secondary | ICD-10-CM | POA: Diagnosis not present

## 2021-07-19 DIAGNOSIS — G8222 Paraplegia, incomplete: Secondary | ICD-10-CM | POA: Diagnosis not present

## 2021-07-19 DIAGNOSIS — F17210 Nicotine dependence, cigarettes, uncomplicated: Secondary | ICD-10-CM | POA: Diagnosis not present

## 2021-07-19 DIAGNOSIS — J982 Interstitial emphysema: Secondary | ICD-10-CM | POA: Diagnosis not present

## 2021-07-19 DIAGNOSIS — Z4789 Encounter for other orthopedic aftercare: Secondary | ICD-10-CM | POA: Diagnosis not present

## 2021-07-21 DIAGNOSIS — M1990 Unspecified osteoarthritis, unspecified site: Secondary | ICD-10-CM | POA: Diagnosis not present

## 2021-07-21 DIAGNOSIS — Z9181 History of falling: Secondary | ICD-10-CM | POA: Diagnosis not present

## 2021-07-21 DIAGNOSIS — Z4789 Encounter for other orthopedic aftercare: Secondary | ICD-10-CM | POA: Diagnosis not present

## 2021-07-21 DIAGNOSIS — M4714 Other spondylosis with myelopathy, thoracic region: Secondary | ICD-10-CM | POA: Diagnosis not present

## 2021-07-21 DIAGNOSIS — I700 Atherosclerosis of aorta: Secondary | ICD-10-CM | POA: Diagnosis not present

## 2021-07-21 DIAGNOSIS — F17210 Nicotine dependence, cigarettes, uncomplicated: Secondary | ICD-10-CM | POA: Diagnosis not present

## 2021-07-21 DIAGNOSIS — D649 Anemia, unspecified: Secondary | ICD-10-CM | POA: Diagnosis not present

## 2021-07-21 DIAGNOSIS — K319 Disease of stomach and duodenum, unspecified: Secondary | ICD-10-CM | POA: Diagnosis not present

## 2021-07-21 DIAGNOSIS — R350 Frequency of micturition: Secondary | ICD-10-CM | POA: Diagnosis not present

## 2021-07-21 DIAGNOSIS — G8222 Paraplegia, incomplete: Secondary | ICD-10-CM | POA: Diagnosis not present

## 2021-07-21 DIAGNOSIS — J982 Interstitial emphysema: Secondary | ICD-10-CM | POA: Diagnosis not present

## 2021-07-26 DIAGNOSIS — M1990 Unspecified osteoarthritis, unspecified site: Secondary | ICD-10-CM | POA: Diagnosis not present

## 2021-07-26 DIAGNOSIS — J982 Interstitial emphysema: Secondary | ICD-10-CM | POA: Diagnosis not present

## 2021-07-26 DIAGNOSIS — R350 Frequency of micturition: Secondary | ICD-10-CM | POA: Diagnosis not present

## 2021-07-26 DIAGNOSIS — K319 Disease of stomach and duodenum, unspecified: Secondary | ICD-10-CM | POA: Diagnosis not present

## 2021-07-26 DIAGNOSIS — D649 Anemia, unspecified: Secondary | ICD-10-CM | POA: Diagnosis not present

## 2021-07-26 DIAGNOSIS — Z9181 History of falling: Secondary | ICD-10-CM | POA: Diagnosis not present

## 2021-07-26 DIAGNOSIS — M4714 Other spondylosis with myelopathy, thoracic region: Secondary | ICD-10-CM | POA: Diagnosis not present

## 2021-07-26 DIAGNOSIS — F17210 Nicotine dependence, cigarettes, uncomplicated: Secondary | ICD-10-CM | POA: Diagnosis not present

## 2021-07-26 DIAGNOSIS — G8222 Paraplegia, incomplete: Secondary | ICD-10-CM | POA: Diagnosis not present

## 2021-07-26 DIAGNOSIS — Z4789 Encounter for other orthopedic aftercare: Secondary | ICD-10-CM | POA: Diagnosis not present

## 2021-07-26 DIAGNOSIS — I700 Atherosclerosis of aorta: Secondary | ICD-10-CM | POA: Diagnosis not present

## 2021-07-29 DIAGNOSIS — M4714 Other spondylosis with myelopathy, thoracic region: Secondary | ICD-10-CM | POA: Diagnosis not present

## 2021-07-29 DIAGNOSIS — J982 Interstitial emphysema: Secondary | ICD-10-CM | POA: Diagnosis not present

## 2021-07-29 DIAGNOSIS — D649 Anemia, unspecified: Secondary | ICD-10-CM | POA: Diagnosis not present

## 2021-07-29 DIAGNOSIS — Z9181 History of falling: Secondary | ICD-10-CM | POA: Diagnosis not present

## 2021-07-29 DIAGNOSIS — R350 Frequency of micturition: Secondary | ICD-10-CM | POA: Diagnosis not present

## 2021-07-29 DIAGNOSIS — M1990 Unspecified osteoarthritis, unspecified site: Secondary | ICD-10-CM | POA: Diagnosis not present

## 2021-07-29 DIAGNOSIS — G8222 Paraplegia, incomplete: Secondary | ICD-10-CM | POA: Diagnosis not present

## 2021-07-29 DIAGNOSIS — Z4789 Encounter for other orthopedic aftercare: Secondary | ICD-10-CM | POA: Diagnosis not present

## 2021-07-29 DIAGNOSIS — I700 Atherosclerosis of aorta: Secondary | ICD-10-CM | POA: Diagnosis not present

## 2021-07-29 DIAGNOSIS — K319 Disease of stomach and duodenum, unspecified: Secondary | ICD-10-CM | POA: Diagnosis not present

## 2021-07-29 DIAGNOSIS — F17210 Nicotine dependence, cigarettes, uncomplicated: Secondary | ICD-10-CM | POA: Diagnosis not present

## 2021-08-01 DIAGNOSIS — J982 Interstitial emphysema: Secondary | ICD-10-CM | POA: Diagnosis not present

## 2021-08-01 DIAGNOSIS — F17210 Nicotine dependence, cigarettes, uncomplicated: Secondary | ICD-10-CM | POA: Diagnosis not present

## 2021-08-01 DIAGNOSIS — K319 Disease of stomach and duodenum, unspecified: Secondary | ICD-10-CM | POA: Diagnosis not present

## 2021-08-01 DIAGNOSIS — R350 Frequency of micturition: Secondary | ICD-10-CM | POA: Diagnosis not present

## 2021-08-01 DIAGNOSIS — Z9181 History of falling: Secondary | ICD-10-CM | POA: Diagnosis not present

## 2021-08-01 DIAGNOSIS — D649 Anemia, unspecified: Secondary | ICD-10-CM | POA: Diagnosis not present

## 2021-08-01 DIAGNOSIS — G8222 Paraplegia, incomplete: Secondary | ICD-10-CM | POA: Diagnosis not present

## 2021-08-01 DIAGNOSIS — Z4789 Encounter for other orthopedic aftercare: Secondary | ICD-10-CM | POA: Diagnosis not present

## 2021-08-01 DIAGNOSIS — M1990 Unspecified osteoarthritis, unspecified site: Secondary | ICD-10-CM | POA: Diagnosis not present

## 2021-08-01 DIAGNOSIS — M4714 Other spondylosis with myelopathy, thoracic region: Secondary | ICD-10-CM | POA: Diagnosis not present

## 2021-08-01 DIAGNOSIS — I700 Atherosclerosis of aorta: Secondary | ICD-10-CM | POA: Diagnosis not present

## 2021-08-04 DIAGNOSIS — I700 Atherosclerosis of aorta: Secondary | ICD-10-CM | POA: Diagnosis not present

## 2021-08-04 DIAGNOSIS — Z9181 History of falling: Secondary | ICD-10-CM | POA: Diagnosis not present

## 2021-08-04 DIAGNOSIS — Z4789 Encounter for other orthopedic aftercare: Secondary | ICD-10-CM | POA: Diagnosis not present

## 2021-08-04 DIAGNOSIS — J982 Interstitial emphysema: Secondary | ICD-10-CM | POA: Diagnosis not present

## 2021-08-04 DIAGNOSIS — M4714 Other spondylosis with myelopathy, thoracic region: Secondary | ICD-10-CM | POA: Diagnosis not present

## 2021-08-04 DIAGNOSIS — K319 Disease of stomach and duodenum, unspecified: Secondary | ICD-10-CM | POA: Diagnosis not present

## 2021-08-04 DIAGNOSIS — M1990 Unspecified osteoarthritis, unspecified site: Secondary | ICD-10-CM | POA: Diagnosis not present

## 2021-08-04 DIAGNOSIS — G8222 Paraplegia, incomplete: Secondary | ICD-10-CM | POA: Diagnosis not present

## 2021-08-04 DIAGNOSIS — F17210 Nicotine dependence, cigarettes, uncomplicated: Secondary | ICD-10-CM | POA: Diagnosis not present

## 2021-08-04 DIAGNOSIS — R350 Frequency of micturition: Secondary | ICD-10-CM | POA: Diagnosis not present

## 2021-08-04 DIAGNOSIS — D649 Anemia, unspecified: Secondary | ICD-10-CM | POA: Diagnosis not present

## 2021-08-08 DIAGNOSIS — I700 Atherosclerosis of aorta: Secondary | ICD-10-CM | POA: Diagnosis not present

## 2021-08-08 DIAGNOSIS — R350 Frequency of micturition: Secondary | ICD-10-CM | POA: Diagnosis not present

## 2021-08-08 DIAGNOSIS — G8310 Monoplegia of lower limb affecting unspecified side: Secondary | ICD-10-CM | POA: Diagnosis not present

## 2021-08-08 DIAGNOSIS — F17210 Nicotine dependence, cigarettes, uncomplicated: Secondary | ICD-10-CM | POA: Diagnosis not present

## 2021-08-08 DIAGNOSIS — K319 Disease of stomach and duodenum, unspecified: Secondary | ICD-10-CM | POA: Diagnosis not present

## 2021-08-08 DIAGNOSIS — D649 Anemia, unspecified: Secondary | ICD-10-CM | POA: Diagnosis not present

## 2021-08-08 DIAGNOSIS — L89150 Pressure ulcer of sacral region, unstageable: Secondary | ICD-10-CM | POA: Diagnosis not present

## 2021-08-08 DIAGNOSIS — Z4789 Encounter for other orthopedic aftercare: Secondary | ICD-10-CM | POA: Diagnosis not present

## 2021-08-08 DIAGNOSIS — M4714 Other spondylosis with myelopathy, thoracic region: Secondary | ICD-10-CM | POA: Diagnosis not present

## 2021-08-08 DIAGNOSIS — M1990 Unspecified osteoarthritis, unspecified site: Secondary | ICD-10-CM | POA: Diagnosis not present

## 2021-08-08 DIAGNOSIS — J982 Interstitial emphysema: Secondary | ICD-10-CM | POA: Diagnosis not present

## 2021-08-08 DIAGNOSIS — Z9181 History of falling: Secondary | ICD-10-CM | POA: Diagnosis not present

## 2021-08-08 DIAGNOSIS — G8222 Paraplegia, incomplete: Secondary | ICD-10-CM | POA: Diagnosis not present

## 2021-08-17 DIAGNOSIS — R350 Frequency of micturition: Secondary | ICD-10-CM | POA: Diagnosis not present

## 2021-08-17 DIAGNOSIS — J982 Interstitial emphysema: Secondary | ICD-10-CM | POA: Diagnosis not present

## 2021-08-17 DIAGNOSIS — I700 Atherosclerosis of aorta: Secondary | ICD-10-CM | POA: Diagnosis not present

## 2021-08-17 DIAGNOSIS — Z9181 History of falling: Secondary | ICD-10-CM | POA: Diagnosis not present

## 2021-08-17 DIAGNOSIS — K319 Disease of stomach and duodenum, unspecified: Secondary | ICD-10-CM | POA: Diagnosis not present

## 2021-08-17 DIAGNOSIS — F17210 Nicotine dependence, cigarettes, uncomplicated: Secondary | ICD-10-CM | POA: Diagnosis not present

## 2021-08-17 DIAGNOSIS — M1990 Unspecified osteoarthritis, unspecified site: Secondary | ICD-10-CM | POA: Diagnosis not present

## 2021-08-17 DIAGNOSIS — Z4789 Encounter for other orthopedic aftercare: Secondary | ICD-10-CM | POA: Diagnosis not present

## 2021-08-17 DIAGNOSIS — D649 Anemia, unspecified: Secondary | ICD-10-CM | POA: Diagnosis not present

## 2021-08-17 DIAGNOSIS — G8222 Paraplegia, incomplete: Secondary | ICD-10-CM | POA: Diagnosis not present

## 2021-08-17 DIAGNOSIS — M4714 Other spondylosis with myelopathy, thoracic region: Secondary | ICD-10-CM | POA: Diagnosis not present

## 2021-08-22 DIAGNOSIS — K319 Disease of stomach and duodenum, unspecified: Secondary | ICD-10-CM | POA: Diagnosis not present

## 2021-08-22 DIAGNOSIS — Z9181 History of falling: Secondary | ICD-10-CM | POA: Diagnosis not present

## 2021-08-22 DIAGNOSIS — J982 Interstitial emphysema: Secondary | ICD-10-CM | POA: Diagnosis not present

## 2021-08-22 DIAGNOSIS — R350 Frequency of micturition: Secondary | ICD-10-CM | POA: Diagnosis not present

## 2021-08-22 DIAGNOSIS — G8222 Paraplegia, incomplete: Secondary | ICD-10-CM | POA: Diagnosis not present

## 2021-08-22 DIAGNOSIS — Z4789 Encounter for other orthopedic aftercare: Secondary | ICD-10-CM | POA: Diagnosis not present

## 2021-08-22 DIAGNOSIS — I700 Atherosclerosis of aorta: Secondary | ICD-10-CM | POA: Diagnosis not present

## 2021-08-22 DIAGNOSIS — D649 Anemia, unspecified: Secondary | ICD-10-CM | POA: Diagnosis not present

## 2021-08-22 DIAGNOSIS — F17210 Nicotine dependence, cigarettes, uncomplicated: Secondary | ICD-10-CM | POA: Diagnosis not present

## 2021-08-22 DIAGNOSIS — M4714 Other spondylosis with myelopathy, thoracic region: Secondary | ICD-10-CM | POA: Diagnosis not present

## 2021-08-22 DIAGNOSIS — M1990 Unspecified osteoarthritis, unspecified site: Secondary | ICD-10-CM | POA: Diagnosis not present

## 2021-08-25 DIAGNOSIS — D649 Anemia, unspecified: Secondary | ICD-10-CM | POA: Diagnosis not present

## 2021-08-25 DIAGNOSIS — F17210 Nicotine dependence, cigarettes, uncomplicated: Secondary | ICD-10-CM | POA: Diagnosis not present

## 2021-08-25 DIAGNOSIS — K319 Disease of stomach and duodenum, unspecified: Secondary | ICD-10-CM | POA: Diagnosis not present

## 2021-08-25 DIAGNOSIS — M1990 Unspecified osteoarthritis, unspecified site: Secondary | ICD-10-CM | POA: Diagnosis not present

## 2021-08-25 DIAGNOSIS — J982 Interstitial emphysema: Secondary | ICD-10-CM | POA: Diagnosis not present

## 2021-08-25 DIAGNOSIS — M4714 Other spondylosis with myelopathy, thoracic region: Secondary | ICD-10-CM | POA: Diagnosis not present

## 2021-08-25 DIAGNOSIS — I700 Atherosclerosis of aorta: Secondary | ICD-10-CM | POA: Diagnosis not present

## 2021-08-25 DIAGNOSIS — Z4789 Encounter for other orthopedic aftercare: Secondary | ICD-10-CM | POA: Diagnosis not present

## 2021-08-25 DIAGNOSIS — R350 Frequency of micturition: Secondary | ICD-10-CM | POA: Diagnosis not present

## 2021-08-25 DIAGNOSIS — Z9181 History of falling: Secondary | ICD-10-CM | POA: Diagnosis not present

## 2021-08-25 DIAGNOSIS — G8222 Paraplegia, incomplete: Secondary | ICD-10-CM | POA: Diagnosis not present

## 2021-08-29 DIAGNOSIS — Z9181 History of falling: Secondary | ICD-10-CM | POA: Diagnosis not present

## 2021-08-29 DIAGNOSIS — R350 Frequency of micturition: Secondary | ICD-10-CM | POA: Diagnosis not present

## 2021-08-29 DIAGNOSIS — I700 Atherosclerosis of aorta: Secondary | ICD-10-CM | POA: Diagnosis not present

## 2021-08-29 DIAGNOSIS — D649 Anemia, unspecified: Secondary | ICD-10-CM | POA: Diagnosis not present

## 2021-08-29 DIAGNOSIS — G8222 Paraplegia, incomplete: Secondary | ICD-10-CM | POA: Diagnosis not present

## 2021-08-29 DIAGNOSIS — M1990 Unspecified osteoarthritis, unspecified site: Secondary | ICD-10-CM | POA: Diagnosis not present

## 2021-08-29 DIAGNOSIS — F17210 Nicotine dependence, cigarettes, uncomplicated: Secondary | ICD-10-CM | POA: Diagnosis not present

## 2021-08-29 DIAGNOSIS — K319 Disease of stomach and duodenum, unspecified: Secondary | ICD-10-CM | POA: Diagnosis not present

## 2021-08-29 DIAGNOSIS — J982 Interstitial emphysema: Secondary | ICD-10-CM | POA: Diagnosis not present

## 2021-08-29 DIAGNOSIS — M4714 Other spondylosis with myelopathy, thoracic region: Secondary | ICD-10-CM | POA: Diagnosis not present

## 2021-08-29 DIAGNOSIS — Z4789 Encounter for other orthopedic aftercare: Secondary | ICD-10-CM | POA: Diagnosis not present

## 2021-08-31 DIAGNOSIS — G8222 Paraplegia, incomplete: Secondary | ICD-10-CM | POA: Diagnosis not present

## 2021-08-31 DIAGNOSIS — R350 Frequency of micturition: Secondary | ICD-10-CM | POA: Diagnosis not present

## 2021-08-31 DIAGNOSIS — K319 Disease of stomach and duodenum, unspecified: Secondary | ICD-10-CM | POA: Diagnosis not present

## 2021-08-31 DIAGNOSIS — J982 Interstitial emphysema: Secondary | ICD-10-CM | POA: Diagnosis not present

## 2021-08-31 DIAGNOSIS — D649 Anemia, unspecified: Secondary | ICD-10-CM | POA: Diagnosis not present

## 2021-08-31 DIAGNOSIS — Z9181 History of falling: Secondary | ICD-10-CM | POA: Diagnosis not present

## 2021-08-31 DIAGNOSIS — Z4789 Encounter for other orthopedic aftercare: Secondary | ICD-10-CM | POA: Diagnosis not present

## 2021-08-31 DIAGNOSIS — I700 Atherosclerosis of aorta: Secondary | ICD-10-CM | POA: Diagnosis not present

## 2021-08-31 DIAGNOSIS — M4714 Other spondylosis with myelopathy, thoracic region: Secondary | ICD-10-CM | POA: Diagnosis not present

## 2021-08-31 DIAGNOSIS — F17210 Nicotine dependence, cigarettes, uncomplicated: Secondary | ICD-10-CM | POA: Diagnosis not present

## 2021-08-31 DIAGNOSIS — M1990 Unspecified osteoarthritis, unspecified site: Secondary | ICD-10-CM | POA: Diagnosis not present

## 2021-09-05 DIAGNOSIS — G8222 Paraplegia, incomplete: Secondary | ICD-10-CM | POA: Diagnosis not present

## 2021-09-05 DIAGNOSIS — M4714 Other spondylosis with myelopathy, thoracic region: Secondary | ICD-10-CM | POA: Diagnosis not present

## 2021-09-05 DIAGNOSIS — F17210 Nicotine dependence, cigarettes, uncomplicated: Secondary | ICD-10-CM | POA: Diagnosis not present

## 2021-09-05 DIAGNOSIS — M1990 Unspecified osteoarthritis, unspecified site: Secondary | ICD-10-CM | POA: Diagnosis not present

## 2021-09-05 DIAGNOSIS — D649 Anemia, unspecified: Secondary | ICD-10-CM | POA: Diagnosis not present

## 2021-09-05 DIAGNOSIS — R350 Frequency of micturition: Secondary | ICD-10-CM | POA: Diagnosis not present

## 2021-09-05 DIAGNOSIS — J982 Interstitial emphysema: Secondary | ICD-10-CM | POA: Diagnosis not present

## 2021-09-05 DIAGNOSIS — I700 Atherosclerosis of aorta: Secondary | ICD-10-CM | POA: Diagnosis not present

## 2021-09-05 DIAGNOSIS — K319 Disease of stomach and duodenum, unspecified: Secondary | ICD-10-CM | POA: Diagnosis not present

## 2021-09-05 DIAGNOSIS — Z9181 History of falling: Secondary | ICD-10-CM | POA: Diagnosis not present

## 2021-09-05 DIAGNOSIS — Z4789 Encounter for other orthopedic aftercare: Secondary | ICD-10-CM | POA: Diagnosis not present

## 2021-09-07 DIAGNOSIS — J982 Interstitial emphysema: Secondary | ICD-10-CM | POA: Diagnosis not present

## 2021-09-07 DIAGNOSIS — G8310 Monoplegia of lower limb affecting unspecified side: Secondary | ICD-10-CM | POA: Diagnosis not present

## 2021-09-07 DIAGNOSIS — L89150 Pressure ulcer of sacral region, unstageable: Secondary | ICD-10-CM | POA: Diagnosis not present

## 2021-09-08 DIAGNOSIS — R350 Frequency of micturition: Secondary | ICD-10-CM | POA: Diagnosis not present

## 2021-09-08 DIAGNOSIS — F17210 Nicotine dependence, cigarettes, uncomplicated: Secondary | ICD-10-CM | POA: Diagnosis not present

## 2021-09-08 DIAGNOSIS — D649 Anemia, unspecified: Secondary | ICD-10-CM | POA: Diagnosis not present

## 2021-09-08 DIAGNOSIS — I700 Atherosclerosis of aorta: Secondary | ICD-10-CM | POA: Diagnosis not present

## 2021-09-08 DIAGNOSIS — J982 Interstitial emphysema: Secondary | ICD-10-CM | POA: Diagnosis not present

## 2021-09-08 DIAGNOSIS — K319 Disease of stomach and duodenum, unspecified: Secondary | ICD-10-CM | POA: Diagnosis not present

## 2021-09-08 DIAGNOSIS — Z4789 Encounter for other orthopedic aftercare: Secondary | ICD-10-CM | POA: Diagnosis not present

## 2021-09-08 DIAGNOSIS — M4714 Other spondylosis with myelopathy, thoracic region: Secondary | ICD-10-CM | POA: Diagnosis not present

## 2021-09-08 DIAGNOSIS — G8222 Paraplegia, incomplete: Secondary | ICD-10-CM | POA: Diagnosis not present

## 2021-09-08 DIAGNOSIS — Z9181 History of falling: Secondary | ICD-10-CM | POA: Diagnosis not present

## 2021-09-08 DIAGNOSIS — M1990 Unspecified osteoarthritis, unspecified site: Secondary | ICD-10-CM | POA: Diagnosis not present

## 2021-10-08 DIAGNOSIS — L89150 Pressure ulcer of sacral region, unstageable: Secondary | ICD-10-CM | POA: Diagnosis not present

## 2021-10-08 DIAGNOSIS — G8310 Monoplegia of lower limb affecting unspecified side: Secondary | ICD-10-CM | POA: Diagnosis not present

## 2021-10-08 DIAGNOSIS — J982 Interstitial emphysema: Secondary | ICD-10-CM | POA: Diagnosis not present

## 2021-10-17 DIAGNOSIS — Z9181 History of falling: Secondary | ICD-10-CM | POA: Diagnosis not present

## 2021-10-17 DIAGNOSIS — E785 Hyperlipidemia, unspecified: Secondary | ICD-10-CM | POA: Diagnosis not present

## 2021-10-17 DIAGNOSIS — Z0000 Encounter for general adult medical examination without abnormal findings: Secondary | ICD-10-CM | POA: Diagnosis not present

## 2021-11-09 DIAGNOSIS — I700 Atherosclerosis of aorta: Secondary | ICD-10-CM | POA: Diagnosis not present

## 2021-11-09 DIAGNOSIS — R350 Frequency of micturition: Secondary | ICD-10-CM | POA: Diagnosis not present

## 2021-11-09 DIAGNOSIS — Z79899 Other long term (current) drug therapy: Secondary | ICD-10-CM | POA: Diagnosis not present

## 2021-11-09 DIAGNOSIS — J439 Emphysema, unspecified: Secondary | ICD-10-CM | POA: Diagnosis not present

## 2021-11-09 DIAGNOSIS — E785 Hyperlipidemia, unspecified: Secondary | ICD-10-CM | POA: Diagnosis not present

## 2021-11-09 DIAGNOSIS — D649 Anemia, unspecified: Secondary | ICD-10-CM | POA: Diagnosis not present

## 2021-11-09 DIAGNOSIS — G8310 Monoplegia of lower limb affecting unspecified side: Secondary | ICD-10-CM | POA: Diagnosis not present

## 2021-11-09 DIAGNOSIS — R911 Solitary pulmonary nodule: Secondary | ICD-10-CM | POA: Diagnosis not present

## 2021-11-16 DIAGNOSIS — J90 Pleural effusion, not elsewhere classified: Secondary | ICD-10-CM | POA: Diagnosis not present

## 2021-11-16 DIAGNOSIS — I700 Atherosclerosis of aorta: Secondary | ICD-10-CM | POA: Diagnosis not present

## 2021-11-16 DIAGNOSIS — R911 Solitary pulmonary nodule: Secondary | ICD-10-CM | POA: Diagnosis not present

## 2022-01-28 IMAGING — MR MR THORACIC SPINE W/O CM
5 of 6 series · 31 of 48 positions shown · non-contrast
Comparison: Loss of feeling in the

CLINICAL DATA: Spinal stenosis of thoracic region

EXAM:
MRI THORACIC SPINE WITHOUT CONTRAST
TECHNIQUE: Multiplanar, multisequence MR imaging of the thoracic spine was
performed. No intravenous contrast was administered.

[Series 16: T1 · sagittal · 5.0mm · 1.88mm/px · 4 of 9 slices shown (1 of 2)]
[im 1/9]
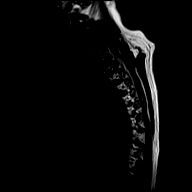
[im 3/9]
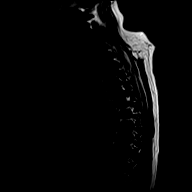
[im 6/9]
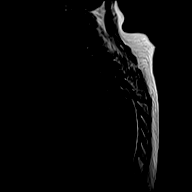
[im 9/9]
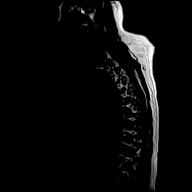

[Series 17: T2 · sagittal · 3.0mm · 1.06mm/px · 6 of 19 slices shown (1 of 2)]
[im 1/19]
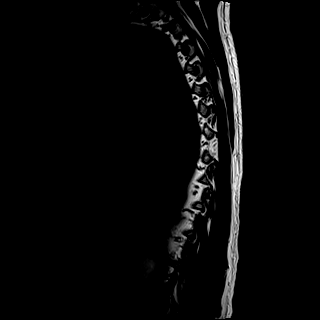
[im 4/19]
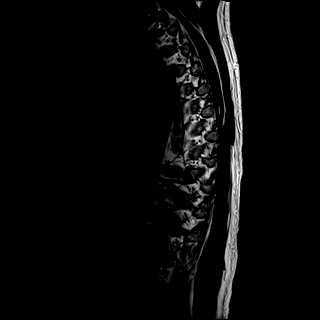
[im 8/19]
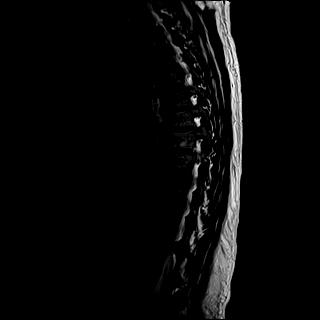
[im 11/19]
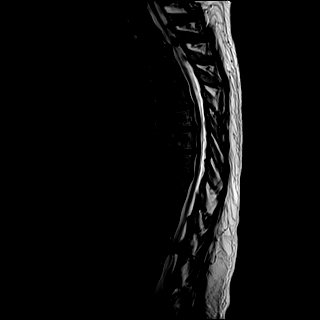
[im 15/19]
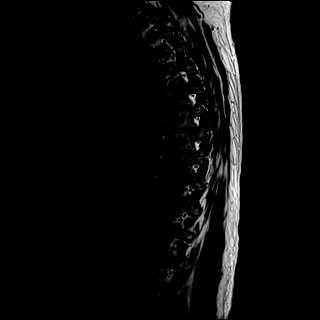
[im 19/19]
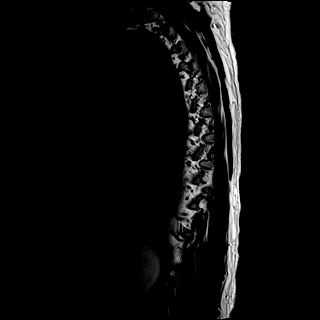

[Series 18: T1 · sagittal · 3.0mm · 1.06mm/px · 6 of 19 slices shown (2 of 2)]
[im 1/19]
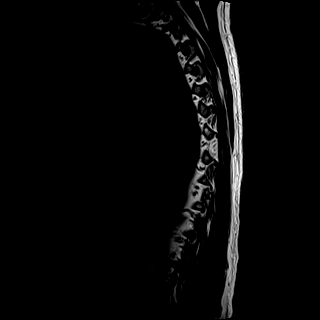
[im 4/19]
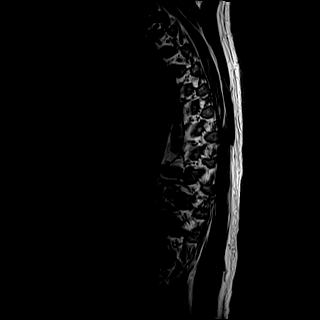
[im 8/19]
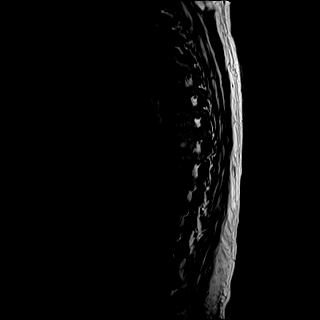
[im 11/19]
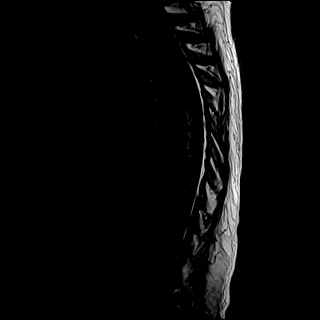
[im 15/19]
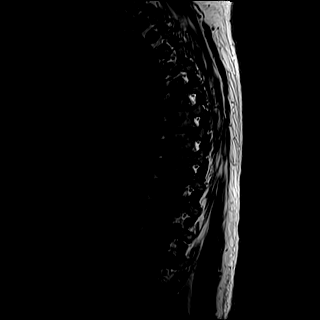
[im 19/19]
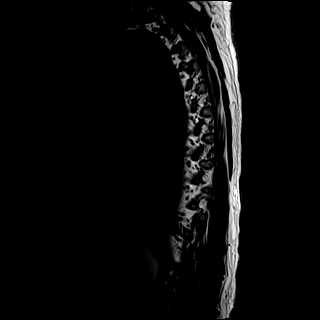

[Series 19: STIR · sagittal · 3.0mm · 0.53mm/px · 6 of 19 slices shown]
[im 1/19]
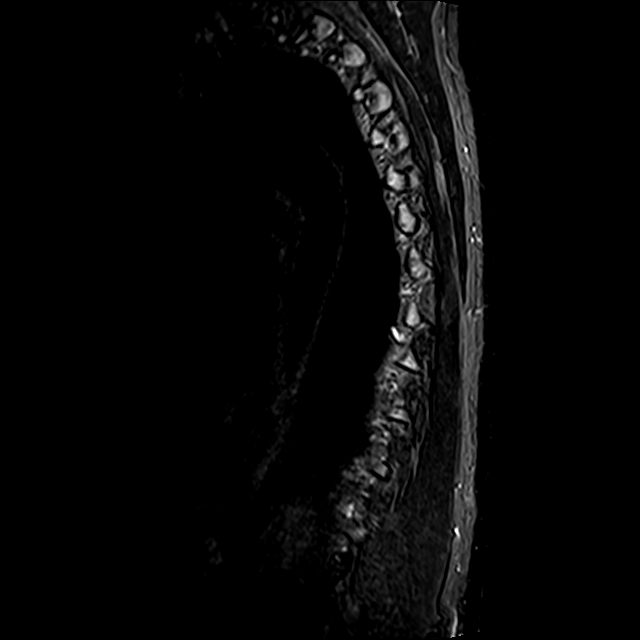
[im 4/19]
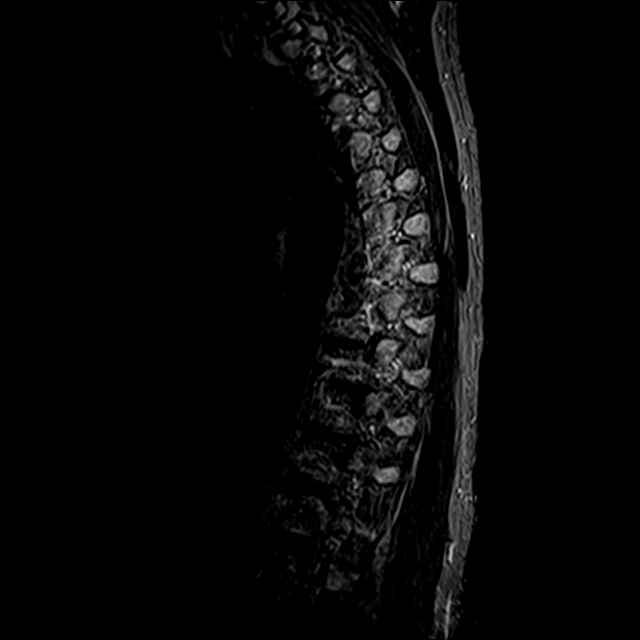
[im 8/19]
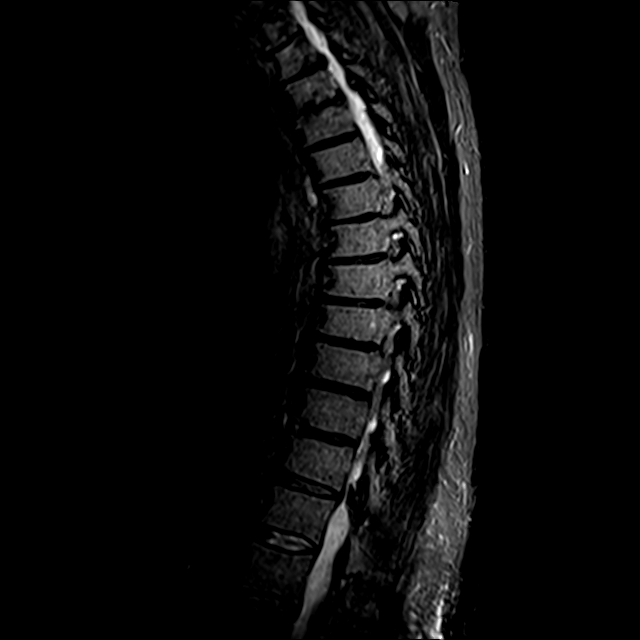
[im 11/19]
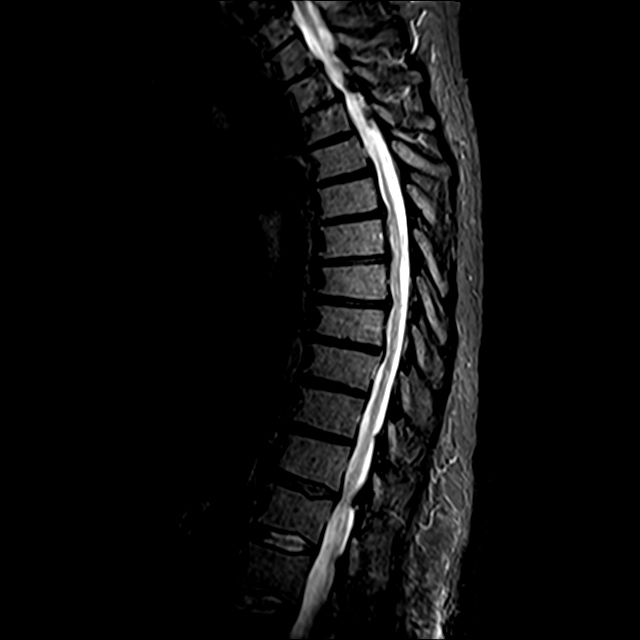
[im 15/19]
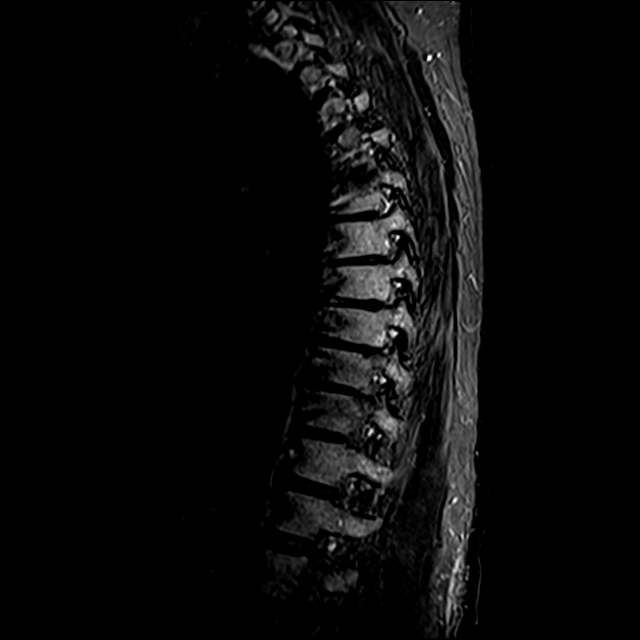
[im 19/19]
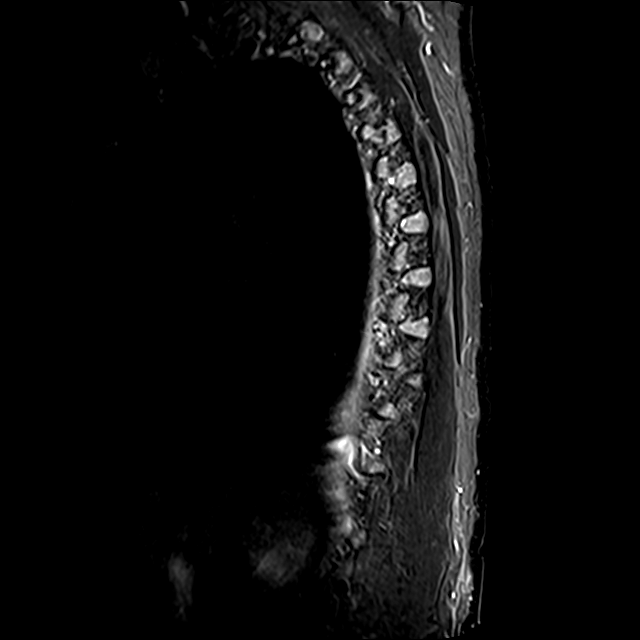

[Series 20: T2 · axial · 4.0mm · 0.59mm/px · z∈[-355,-139]mm · 9 of 39 slices shown (2 of 2)]
[im 1/39]
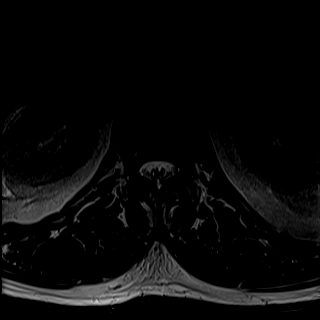
[im 7/39]
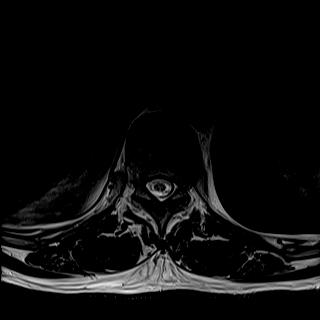
[im 13/39]
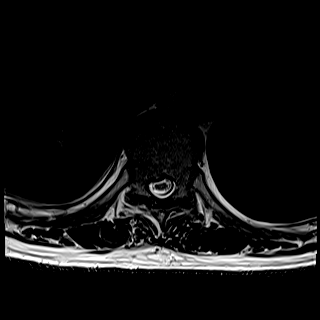
[im 16/39]
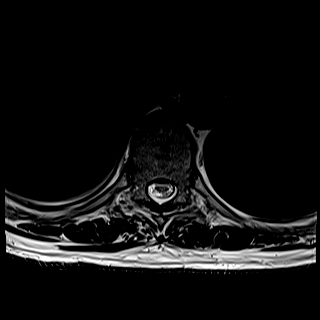
[im 20/39]
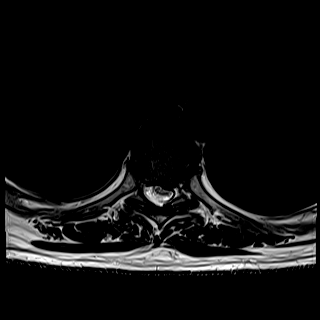
[im 23/39]
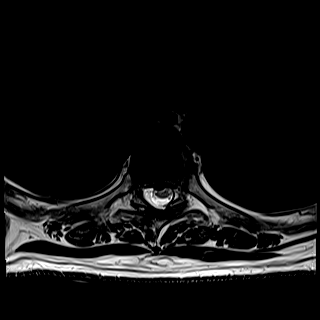
[im 26/39]
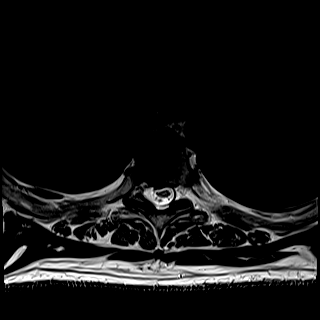
[im 32/39]
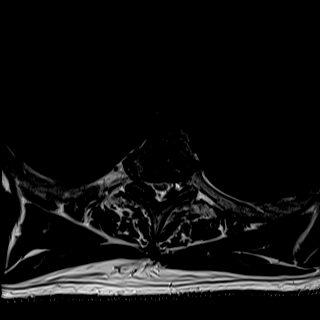
[im 39/39]
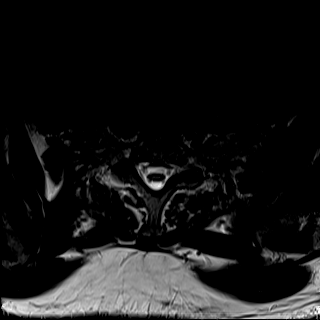

[31 of 48 positions shown; findings below may reference images not displayed]

FINDINGS: Alignment:  Exaggerated thoracic kyphosis.  No listhesis.

Vertebrae: No fracture, evidence of discitis, or bone lesion.

Cord: Diffuse thinning of the cord. T2 hyperintensity is seen over a
short extent in the cord at the level of T11-12. Multilevel
discogenic cord deformity described below.

Paraspinal and other soft tissues: No evidence of perispinal mass or
inflammation.

Disc levels:

Diffuse disc space narrowing, disc bulging, and ventral endplate
spurring. Upper and midthoracic facet spurring and ligamentum flavum
thickening. Additional ligamentum flavum thickening of note at
T10-11 and T11-12. Superimposed disc protrusion at all thoracic
levels sparing T9-10 and T10-11. All the herniations cause ventral
cord deformity. Disc disease and ligamentum flavum thickening causes
advanced spinal stenosis at T11-12. Mild-to-moderate canal narrowing
at T2-3. Facet spurs encroach on right-sided foramina primarily at
T11-12. Left foraminal impingement is not visualized.
IMPRESSION: 1. Diffuse disc degeneration with herniation at nearly every
thoracic level. Upper and lower thoracic facet osteoarthritis with
nodular ligamentum flavum thickening.
2. Dominant findings at T11-12 where there is advanced spinal
stenosis with cord compression and gliosis. The cord appears
diffusely thin.
3. Focal advanced foraminal impingement on the right at T11-12.

## 2022-02-09 IMAGING — RF DG THORACOLUMBAR SPINE 2V
1 series · 6 of 6 positions shown · non-contrast
Comparison: Thoracic spine MRI 01/06/2021. Lumbar spine MRI
12/07/2020. Chest CT 06/13/2019.

CLINICAL DATA: Surgery, elective. Additional history provided:
T11-T12 laminectomy. Provided fluoroscopy time 28 seconds (28.84
mGy).

EXAM:
THORACOLUMBAR SPINE 1V

[Series 1: run · 6 of 6 slices shown]
[im 1/6]
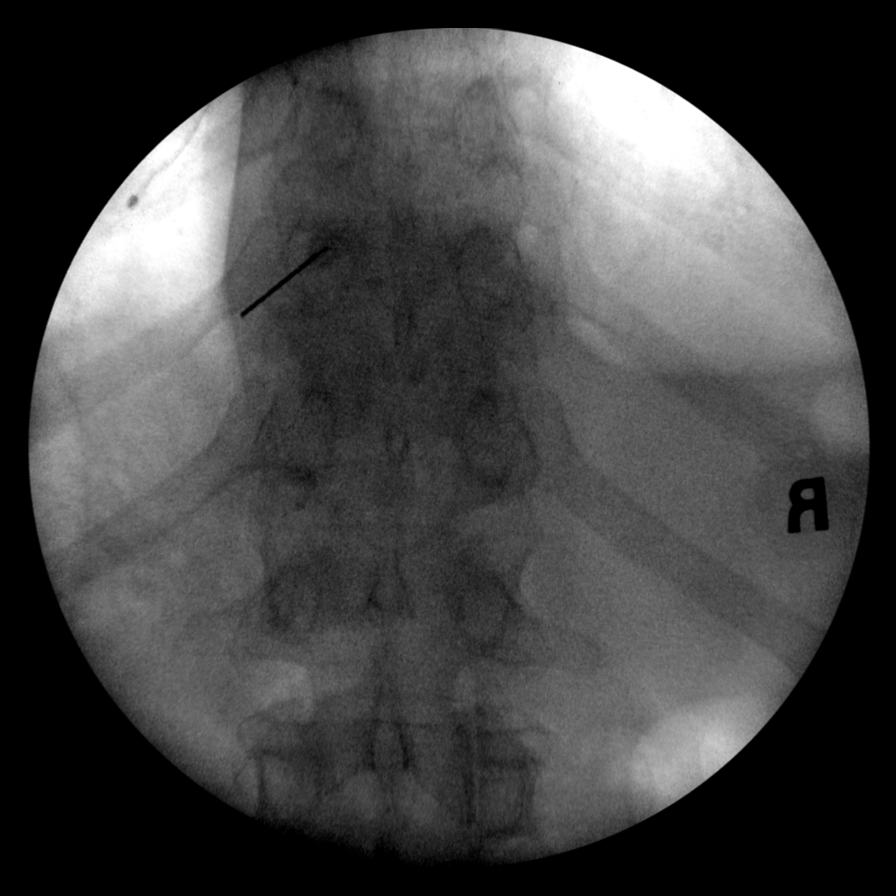
[im 2/6]
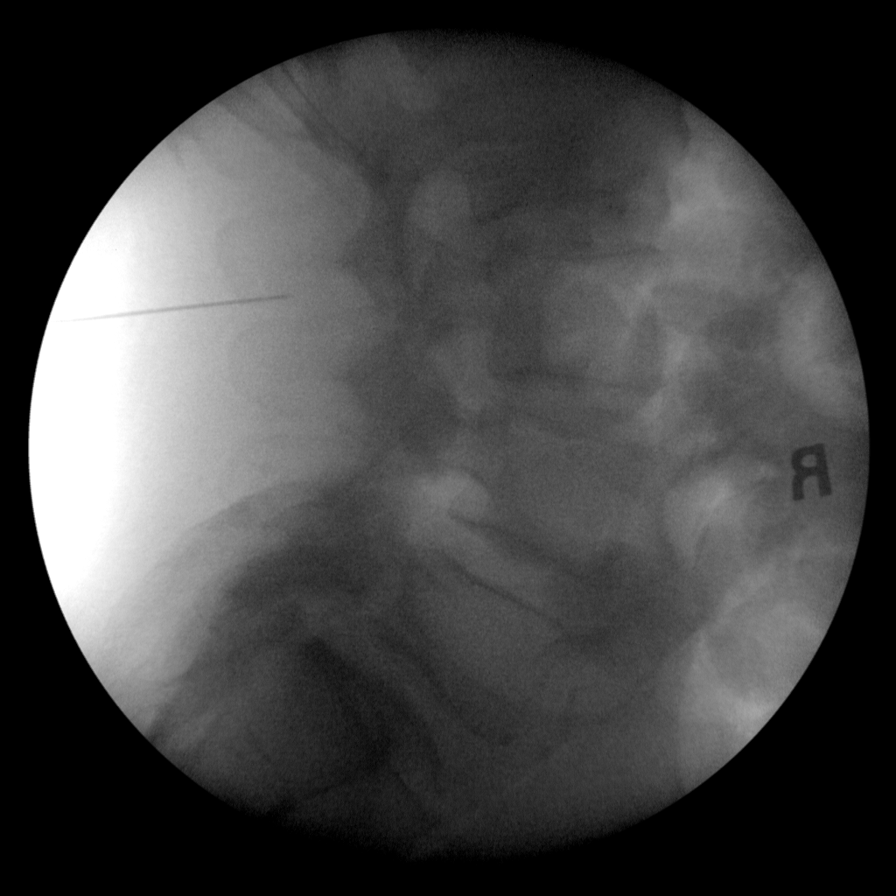
[im 3/6]
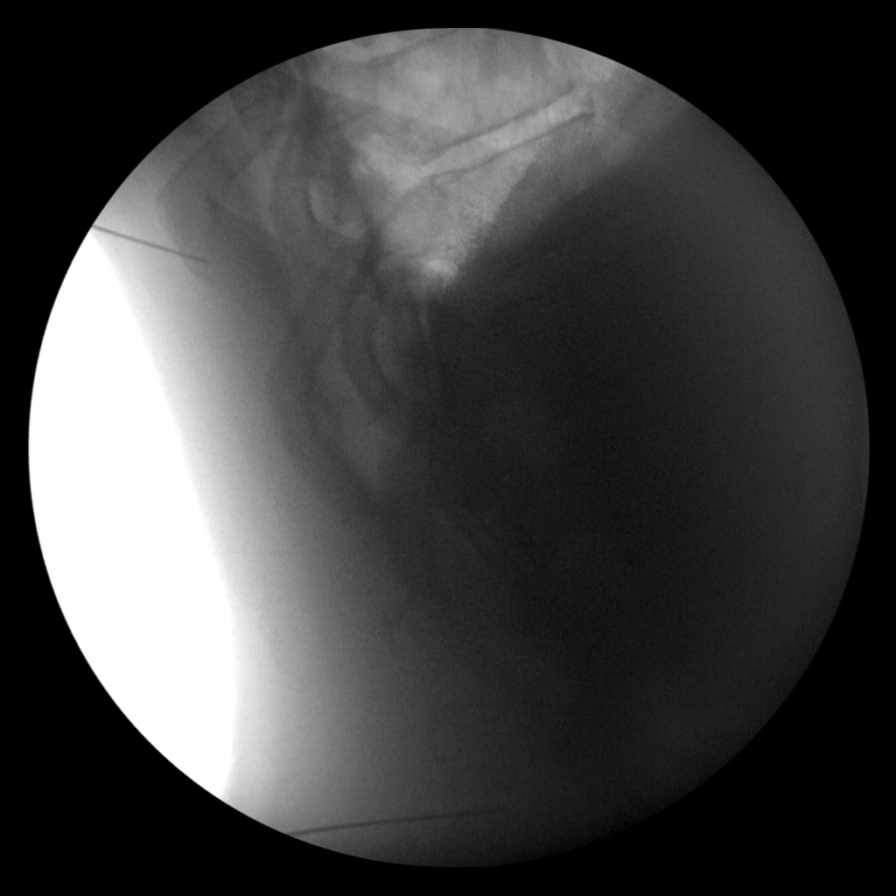
[im 4/6]
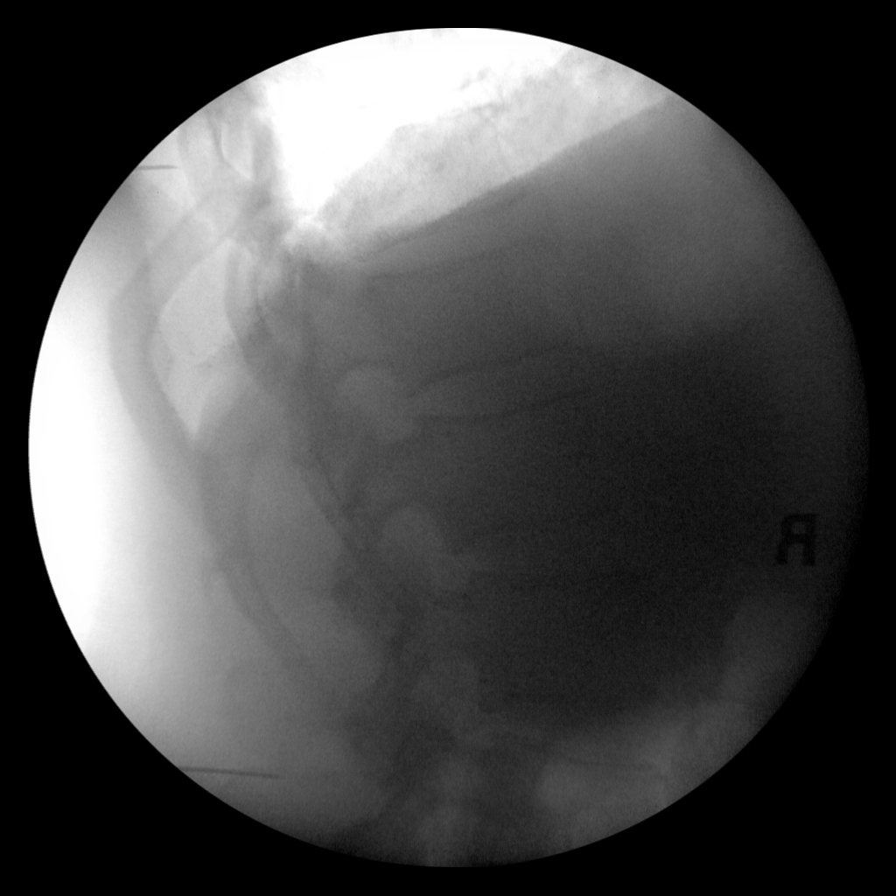
[im 5/6]
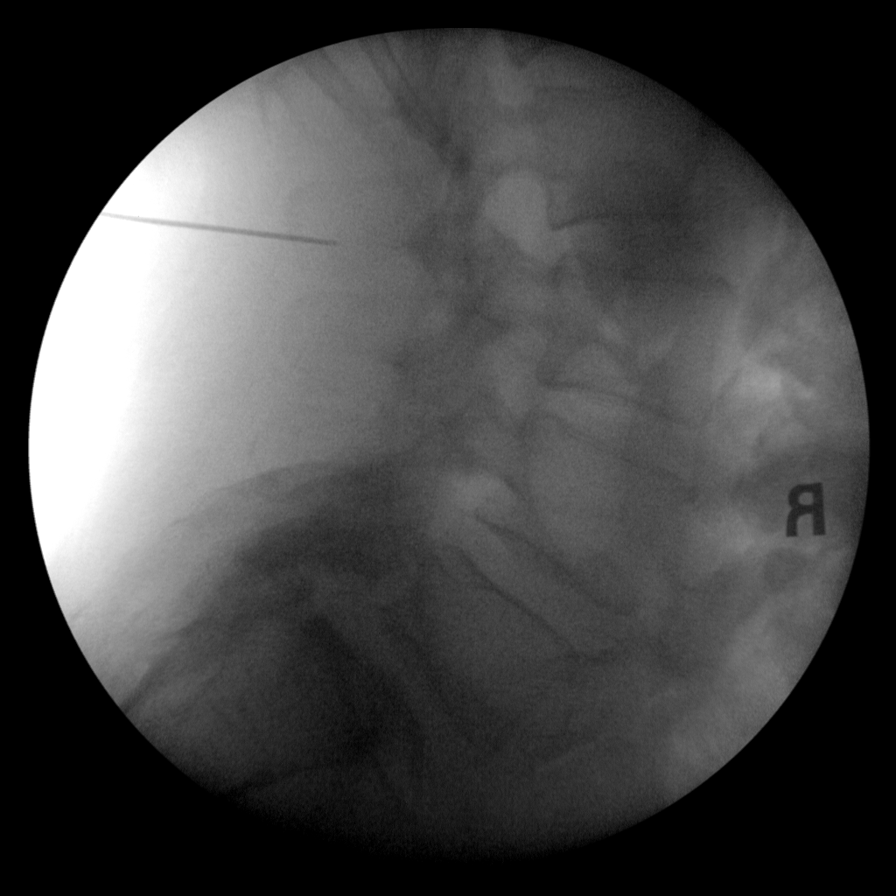
[im 6/6]
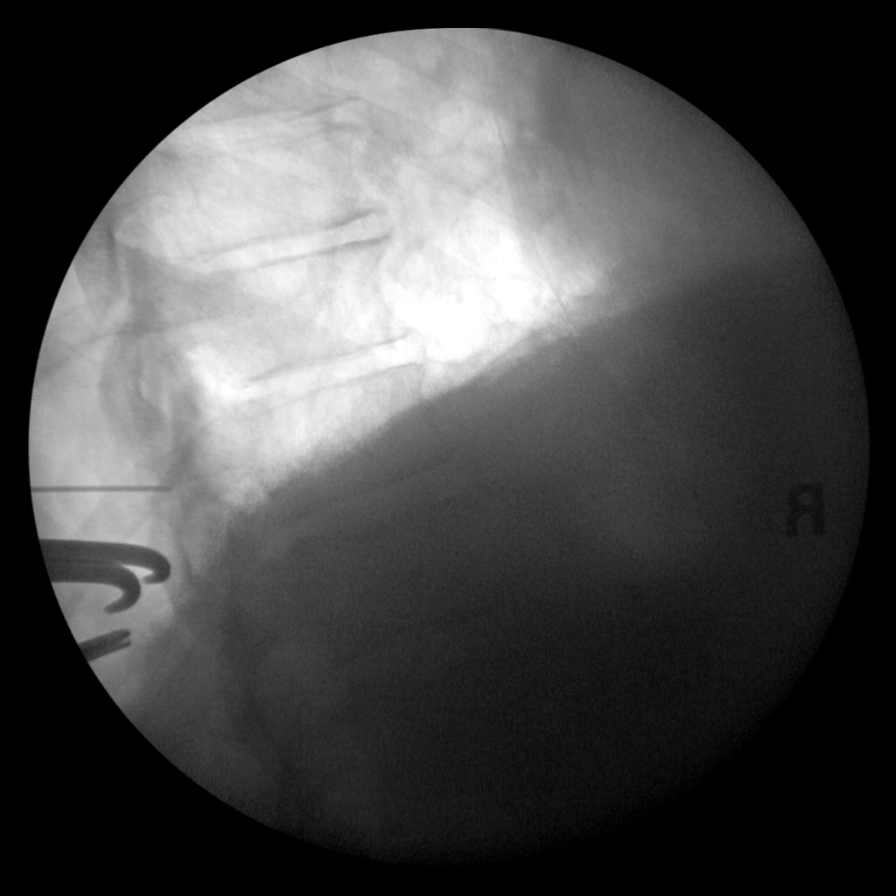

[6 of 6 positions shown; findings below may reference images not displayed]

FINDINGS: Six intraoperative fluoroscopic images of the thoracolumbar spine
are submitted. On the initial AP intraoperative fluoroscopic image
of the thoracic spine, a metallic site marker projects over the
thoracic spine at the T11 level on the left. On the subsequent
lateral view image of the lumbar spine, a metallic site marker
projects posterior to the lumbar spine at the L3 level. On the two
subsequent lateral view images of the lower thoracic spine, a
metallic site marker projects posterior to the lower thoracic spine
(although the exact spinal level is difficult to ascertain). On the
subsequent lateral view fluoroscopic image of the lumbar spine, a
metallic site marker projects posterior lumbar spine at the L3
level. On the subsequent lateral view radiograph of the
thoracolumbar junction, a metallic site marker and retractors
project posterior to what appears to be the T11-T12 level.
IMPRESSION: Six intraoperative fluoroscopic images of the thoracolumbar spine
for localization, as described.

## 2022-02-09 IMAGING — RF DG C-ARM 1-60 MIN
1 series · 6 of 6 positions shown · non-contrast
Comparison: Thoracic spine MRI 01/06/2021. Lumbar spine MRI
12/07/2020. Chest CT 06/13/2019.

CLINICAL DATA: Surgery, elective. Additional history provided:
T11-T12 laminectomy. Provided fluoroscopy time 28 seconds (28.84
mGy).

EXAM:
THORACOLUMBAR SPINE 1V

[Series 1: run · 6 of 6 slices shown]
[im 1/6]
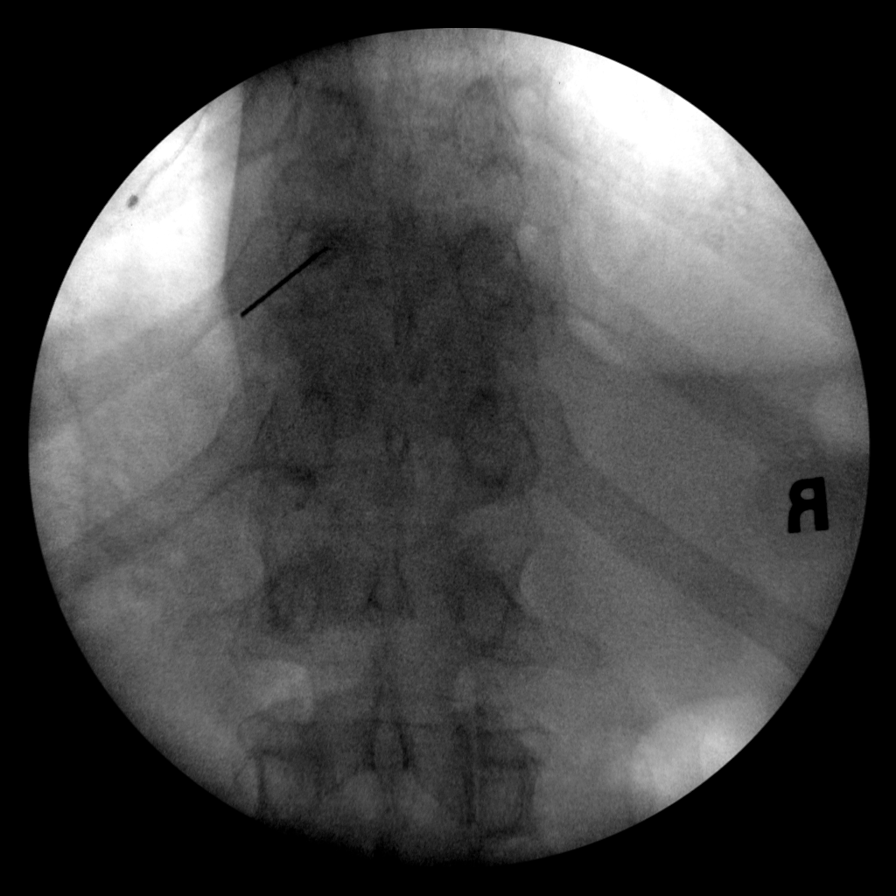
[im 2/6]
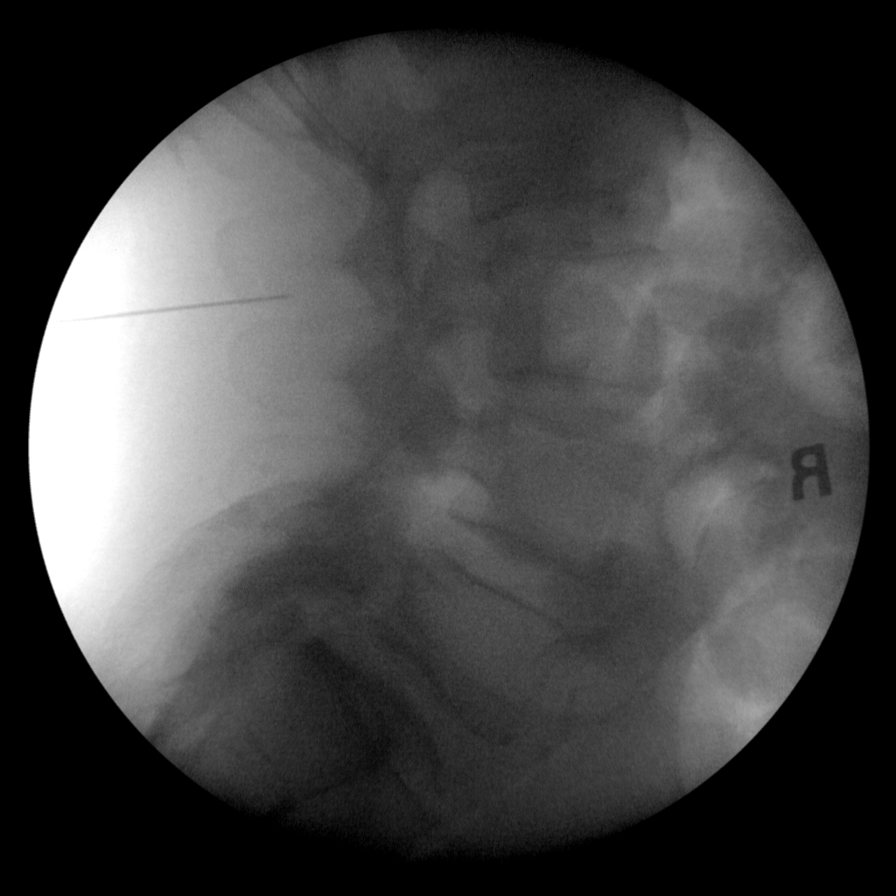
[im 3/6]
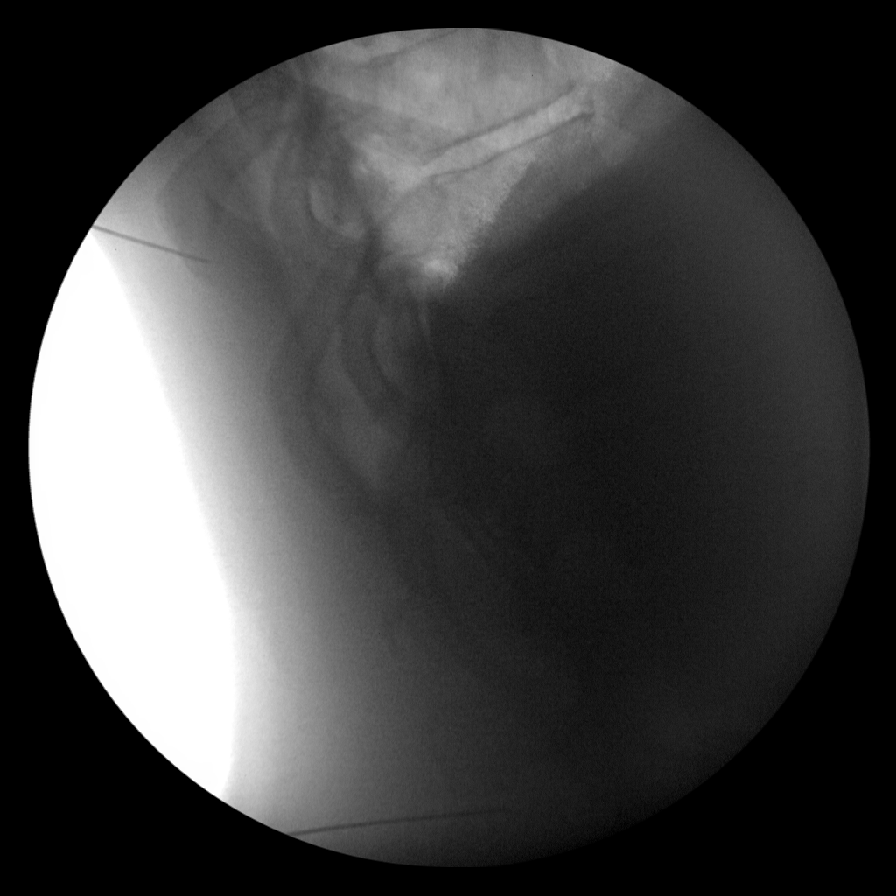
[im 4/6]
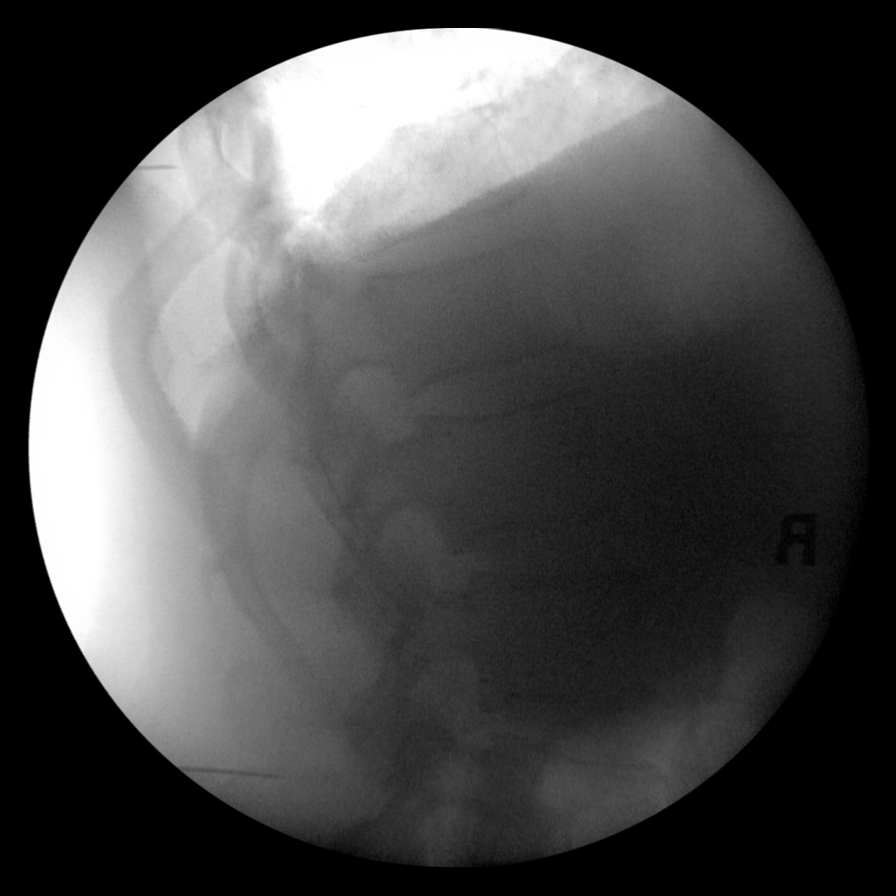
[im 5/6]
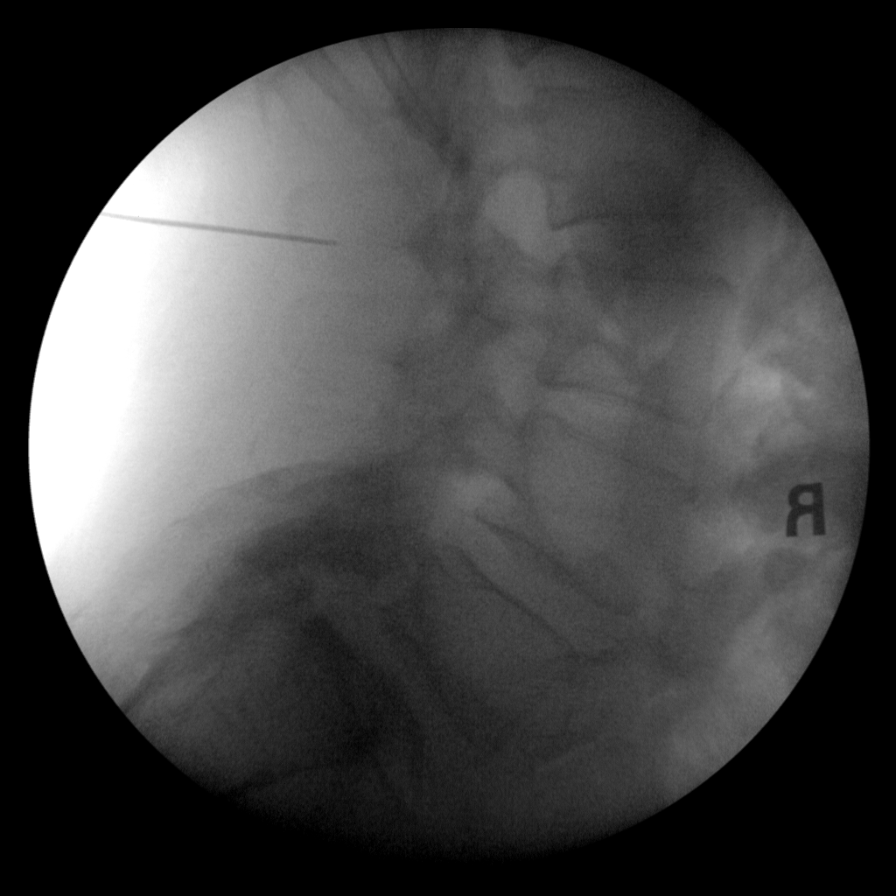
[im 6/6]
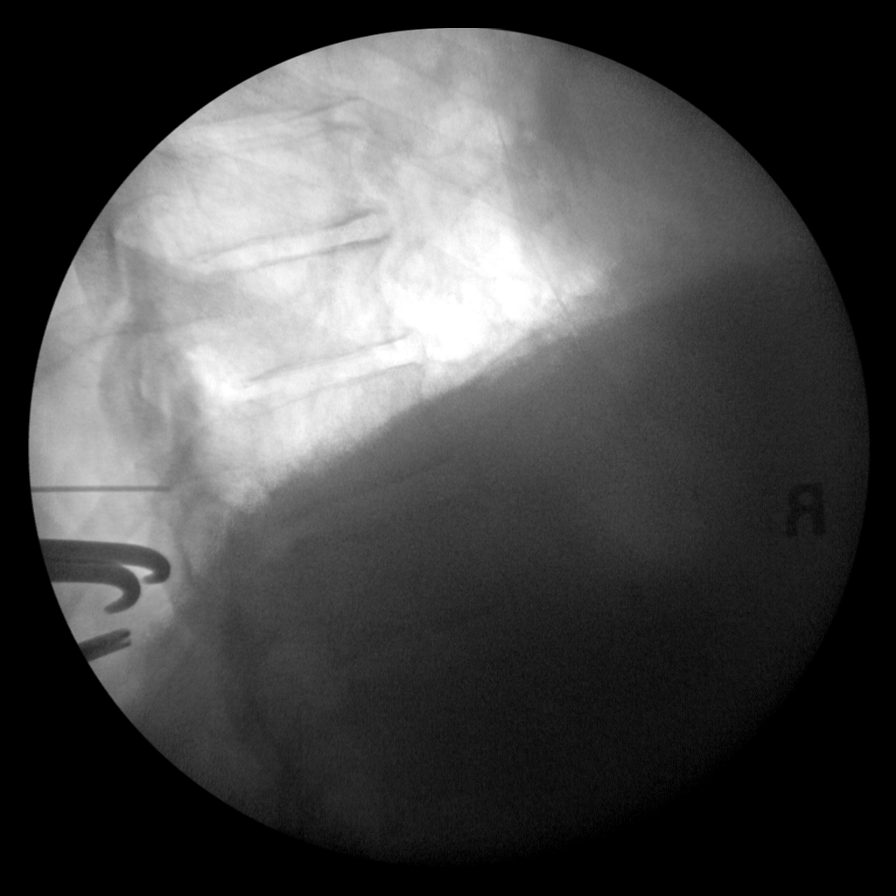

[6 of 6 positions shown; findings below may reference images not displayed]

FINDINGS: Six intraoperative fluoroscopic images of the thoracolumbar spine
are submitted. On the initial AP intraoperative fluoroscopic image
of the thoracic spine, a metallic site marker projects over the
thoracic spine at the T11 level on the left. On the subsequent
lateral view image of the lumbar spine, a metallic site marker
projects posterior to the lumbar spine at the L3 level. On the two
subsequent lateral view images of the lower thoracic spine, a
metallic site marker projects posterior to the lower thoracic spine
(although the exact spinal level is difficult to ascertain). On the
subsequent lateral view fluoroscopic image of the lumbar spine, a
metallic site marker projects posterior lumbar spine at the L3
level. On the subsequent lateral view radiograph of the
thoracolumbar junction, a metallic site marker and retractors
project posterior to what appears to be the T11-T12 level.
IMPRESSION: Six intraoperative fluoroscopic images of the thoracolumbar spine
for localization, as described.

## 2022-05-10 DIAGNOSIS — R350 Frequency of micturition: Secondary | ICD-10-CM | POA: Diagnosis not present

## 2022-05-10 DIAGNOSIS — I700 Atherosclerosis of aorta: Secondary | ICD-10-CM | POA: Diagnosis not present

## 2022-05-10 DIAGNOSIS — Z139 Encounter for screening, unspecified: Secondary | ICD-10-CM | POA: Diagnosis not present

## 2022-05-10 DIAGNOSIS — Z79899 Other long term (current) drug therapy: Secondary | ICD-10-CM | POA: Diagnosis not present

## 2022-05-10 DIAGNOSIS — E785 Hyperlipidemia, unspecified: Secondary | ICD-10-CM | POA: Diagnosis not present

## 2022-05-10 DIAGNOSIS — G8310 Monoplegia of lower limb affecting unspecified side: Secondary | ICD-10-CM | POA: Diagnosis not present

## 2022-05-10 DIAGNOSIS — Z8719 Personal history of other diseases of the digestive system: Secondary | ICD-10-CM | POA: Diagnosis not present

## 2022-05-10 DIAGNOSIS — D649 Anemia, unspecified: Secondary | ICD-10-CM | POA: Diagnosis not present

## 2022-05-10 DIAGNOSIS — J439 Emphysema, unspecified: Secondary | ICD-10-CM | POA: Diagnosis not present

## 2022-05-10 DIAGNOSIS — R911 Solitary pulmonary nodule: Secondary | ICD-10-CM | POA: Diagnosis not present

## 2022-08-24 DIAGNOSIS — I2510 Atherosclerotic heart disease of native coronary artery without angina pectoris: Secondary | ICD-10-CM | POA: Diagnosis not present

## 2022-08-24 DIAGNOSIS — E785 Hyperlipidemia, unspecified: Secondary | ICD-10-CM | POA: Diagnosis not present

## 2022-11-14 DIAGNOSIS — E559 Vitamin D deficiency, unspecified: Secondary | ICD-10-CM | POA: Diagnosis not present

## 2022-11-14 DIAGNOSIS — R350 Frequency of micturition: Secondary | ICD-10-CM | POA: Diagnosis not present

## 2022-11-14 DIAGNOSIS — E785 Hyperlipidemia, unspecified: Secondary | ICD-10-CM | POA: Diagnosis not present

## 2022-11-14 DIAGNOSIS — R911 Solitary pulmonary nodule: Secondary | ICD-10-CM | POA: Diagnosis not present

## 2022-11-14 DIAGNOSIS — D649 Anemia, unspecified: Secondary | ICD-10-CM | POA: Diagnosis not present

## 2022-11-14 DIAGNOSIS — Z8719 Personal history of other diseases of the digestive system: Secondary | ICD-10-CM | POA: Diagnosis not present

## 2022-11-14 DIAGNOSIS — Z23 Encounter for immunization: Secondary | ICD-10-CM | POA: Diagnosis not present

## 2022-11-14 DIAGNOSIS — Z79899 Other long term (current) drug therapy: Secondary | ICD-10-CM | POA: Diagnosis not present

## 2022-11-14 DIAGNOSIS — J439 Emphysema, unspecified: Secondary | ICD-10-CM | POA: Diagnosis not present

## 2022-11-14 DIAGNOSIS — G8310 Monoplegia of lower limb affecting unspecified side: Secondary | ICD-10-CM | POA: Diagnosis not present

## 2022-11-14 DIAGNOSIS — Z9181 History of falling: Secondary | ICD-10-CM | POA: Diagnosis not present

## 2022-11-14 DIAGNOSIS — I700 Atherosclerosis of aorta: Secondary | ICD-10-CM | POA: Diagnosis not present

## 2022-12-18 DIAGNOSIS — D72819 Decreased white blood cell count, unspecified: Secondary | ICD-10-CM | POA: Diagnosis not present

## 2023-03-06 DIAGNOSIS — Z79899 Other long term (current) drug therapy: Secondary | ICD-10-CM | POA: Diagnosis not present

## 2023-05-16 DIAGNOSIS — E785 Hyperlipidemia, unspecified: Secondary | ICD-10-CM | POA: Diagnosis not present

## 2023-05-16 DIAGNOSIS — G252 Other specified forms of tremor: Secondary | ICD-10-CM | POA: Diagnosis not present

## 2023-05-16 DIAGNOSIS — R350 Frequency of micturition: Secondary | ICD-10-CM | POA: Diagnosis not present

## 2023-05-16 DIAGNOSIS — J439 Emphysema, unspecified: Secondary | ICD-10-CM | POA: Diagnosis not present

## 2023-05-16 DIAGNOSIS — R911 Solitary pulmonary nodule: Secondary | ICD-10-CM | POA: Diagnosis not present

## 2023-05-16 DIAGNOSIS — Z8719 Personal history of other diseases of the digestive system: Secondary | ICD-10-CM | POA: Diagnosis not present

## 2023-05-16 DIAGNOSIS — Z139 Encounter for screening, unspecified: Secondary | ICD-10-CM | POA: Diagnosis not present

## 2023-05-16 DIAGNOSIS — Z79899 Other long term (current) drug therapy: Secondary | ICD-10-CM | POA: Diagnosis not present

## 2023-07-12 DIAGNOSIS — Z0000 Encounter for general adult medical examination without abnormal findings: Secondary | ICD-10-CM | POA: Diagnosis not present

## 2023-07-12 DIAGNOSIS — Z9181 History of falling: Secondary | ICD-10-CM | POA: Diagnosis not present

## 2023-07-12 DIAGNOSIS — Z139 Encounter for screening, unspecified: Secondary | ICD-10-CM | POA: Diagnosis not present

## 2023-11-21 DIAGNOSIS — G8310 Monoplegia of lower limb affecting unspecified side: Secondary | ICD-10-CM | POA: Diagnosis not present

## 2023-11-21 DIAGNOSIS — E785 Hyperlipidemia, unspecified: Secondary | ICD-10-CM | POA: Diagnosis not present

## 2023-11-21 DIAGNOSIS — Z23 Encounter for immunization: Secondary | ICD-10-CM | POA: Diagnosis not present

## 2023-11-21 DIAGNOSIS — Z79899 Other long term (current) drug therapy: Secondary | ICD-10-CM | POA: Diagnosis not present

## 2023-11-21 DIAGNOSIS — R739 Hyperglycemia, unspecified: Secondary | ICD-10-CM | POA: Diagnosis not present

## 2023-11-21 DIAGNOSIS — Z8719 Personal history of other diseases of the digestive system: Secondary | ICD-10-CM | POA: Diagnosis not present

## 2023-11-21 DIAGNOSIS — J439 Emphysema, unspecified: Secondary | ICD-10-CM | POA: Diagnosis not present

## 2023-11-21 DIAGNOSIS — R911 Solitary pulmonary nodule: Secondary | ICD-10-CM | POA: Diagnosis not present

## 2023-11-21 DIAGNOSIS — G252 Other specified forms of tremor: Secondary | ICD-10-CM | POA: Diagnosis not present

## 2023-11-21 DIAGNOSIS — D649 Anemia, unspecified: Secondary | ICD-10-CM | POA: Diagnosis not present

## 2024-05-29 DIAGNOSIS — D649 Anemia, unspecified: Secondary | ICD-10-CM | POA: Diagnosis not present

## 2024-05-29 DIAGNOSIS — Z23 Encounter for immunization: Secondary | ICD-10-CM | POA: Diagnosis not present

## 2024-05-29 DIAGNOSIS — Z79899 Other long term (current) drug therapy: Secondary | ICD-10-CM | POA: Diagnosis not present

## 2024-05-29 DIAGNOSIS — G8310 Monoplegia of lower limb affecting unspecified side: Secondary | ICD-10-CM | POA: Diagnosis not present

## 2024-05-29 DIAGNOSIS — Z8719 Personal history of other diseases of the digestive system: Secondary | ICD-10-CM | POA: Diagnosis not present

## 2024-05-29 DIAGNOSIS — G252 Other specified forms of tremor: Secondary | ICD-10-CM | POA: Diagnosis not present

## 2024-05-29 DIAGNOSIS — R911 Solitary pulmonary nodule: Secondary | ICD-10-CM | POA: Diagnosis not present

## 2024-05-29 DIAGNOSIS — J439 Emphysema, unspecified: Secondary | ICD-10-CM | POA: Diagnosis not present

## 2024-11-03 ENCOUNTER — Ambulatory Visit: Admitting: Neurology
